# Patient Record
Sex: Female | Born: 2000 | Hispanic: Yes | Marital: Single | Smoking: Never smoker
Health system: Southern US, Community
[De-identification: ages and names within clinical notes are randomized; demographics above are authoritative.]

## PROBLEM LIST (undated history)

## (undated) ENCOUNTER — Ambulatory Visit: Discharge status: Against Medical Advice | Payer: Medicaid Other

## (undated) DIAGNOSIS — F32A Depression, unspecified: Secondary | ICD-10-CM

## (undated) DIAGNOSIS — F419 Anxiety disorder, unspecified: Secondary | ICD-10-CM

## (undated) DIAGNOSIS — J45909 Unspecified asthma, uncomplicated: Secondary | ICD-10-CM

## (undated) DIAGNOSIS — E669 Obesity, unspecified: Secondary | ICD-10-CM

## (undated) HISTORY — DX: Obesity, unspecified: E66.9

## (undated) HISTORY — DX: Unspecified asthma, uncomplicated: J45.909

## (undated) HISTORY — PX: WISDOM TOOTH EXTRACTION: SHX21

## (undated) HISTORY — DX: Anxiety disorder, unspecified: F41.9

## (undated) HISTORY — PX: TONSILLECTOMY: SUR1361

## (undated) HISTORY — DX: Depression, unspecified: F32.A

---
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
## 2020-03-13 ENCOUNTER — Other Ambulatory Visit: Payer: Self-pay

## 2020-03-13 ENCOUNTER — Encounter: Payer: Self-pay | Admitting: Family Medicine

## 2020-03-13 ENCOUNTER — Ambulatory Visit (LOCAL_COMMUNITY_HEALTH_CENTER): Payer: Medicaid Other | Admitting: Family Medicine

## 2020-03-13 VITALS — BP 120/76 | HR 80 | Ht 67.0 in | Wt 242.0 lb

## 2020-03-13 DIAGNOSIS — Z3201 Encounter for pregnancy test, result positive: Secondary | ICD-10-CM

## 2020-03-13 LAB — PREGNANCY, URINE: Preg Test, Ur: POSITIVE — AB

## 2020-04-28 ENCOUNTER — Telehealth: Payer: Self-pay

## 2020-04-28 NOTE — Telephone Encounter (Signed)
Via interpreter M. [place]-attempted to call to f/u +PT and if has prenatal care provider; left voicemail message Sharlette Dense, RN

## 2020-04-28 NOTE — Telephone Encounter (Signed)
Client returned call-reports went to [facility] and was told unable to have appt. Until 05/16/20 so now has appt to start care at [facility], RN

## 2020-11-21 ENCOUNTER — Other Ambulatory Visit: Payer: Self-pay

## 2020-11-21 ENCOUNTER — Emergency Department: Payer: Medicaid Other | Admitting: Anesthesiology

## 2020-11-21 ENCOUNTER — Ambulatory Visit
Admission: EM | Admit: 2020-11-21 | Discharge: 2020-11-22 | Disposition: A | Discharge status: Home / Self Care | Payer: Medicaid Other | Attending: Emergency Medicine | Admitting: Emergency Medicine

## 2020-11-21 ENCOUNTER — Emergency Department: Payer: Medicaid Other

## 2020-11-21 ENCOUNTER — Encounter
Admission: EM | Disposition: A | Discharge status: Home / Self Care | Payer: Self-pay | Source: Home / Self Care | Attending: Emergency Medicine

## 2020-11-21 DIAGNOSIS — Z20822 Contact with and (suspected) exposure to covid-19: Secondary | ICD-10-CM | POA: Diagnosis not present

## 2020-11-21 DIAGNOSIS — N939 Abnormal uterine and vaginal bleeding, unspecified: Secondary | ICD-10-CM

## 2020-11-21 DIAGNOSIS — R102 Pelvic and perineal pain unspecified side: Secondary | ICD-10-CM

## 2020-11-21 DIAGNOSIS — Z30432 Encounter for removal of intrauterine contraceptive device: Secondary | ICD-10-CM

## 2020-11-21 HISTORY — PX: DILATION AND CURETTAGE OF UTERUS: SHX78

## 2020-11-21 HISTORY — PX: IUD REMOVAL: SHX5392

## 2020-11-21 LAB — CBC WITH DIFFERENTIAL/PLATELET
Abs Immature Granulocytes: 0.1 10*3/uL — ABNORMAL HIGH (ref 0.00–0.07)
Basophils Absolute: 0 10*3/uL (ref 0.0–0.1)
Basophils Relative: 0 %
Eosinophils Absolute: 0.4 10*3/uL (ref 0.0–0.5)
Eosinophils Relative: 3 %
HCT: 34.3 % — ABNORMAL LOW (ref 36.0–46.0)
Hemoglobin: 11.2 g/dL — ABNORMAL LOW (ref 12.0–15.0)
Immature Granulocytes: 1 %
Lymphocytes Relative: 17 %
Lymphs Abs: 1.9 10*3/uL (ref 0.7–4.0)
MCH: 28.6 pg (ref 26.0–34.0)
MCHC: 32.7 g/dL (ref 30.0–36.0)
MCV: 87.7 fL (ref 80.0–100.0)
Monocytes Absolute: 0.7 10*3/uL (ref 0.1–1.0)
Monocytes Relative: 7 %
Neutro Abs: 7.8 10*3/uL — ABNORMAL HIGH (ref 1.7–7.7)
Neutrophils Relative %: 72 %
Platelets: 390 10*3/uL (ref 150–400)
RBC: 3.91 MIL/uL (ref 3.87–5.11)
RDW: 13.5 % (ref 11.5–15.5)
WBC: 10.9 10*3/uL — ABNORMAL HIGH (ref 4.0–10.5)
nRBC: 0 % (ref 0.0–0.2)

## 2020-11-21 LAB — HCG, QUANTITATIVE, PREGNANCY: hCG, Beta Chain, Quant, S: 19 m[IU]/mL — ABNORMAL HIGH (ref <5)

## 2020-11-21 LAB — COMPREHENSIVE METABOLIC PANEL
ALT: 46 U/L — ABNORMAL HIGH (ref 0–44)
AST: 53 U/L — ABNORMAL HIGH (ref 15–41)
Albumin: 3.7 g/dL (ref 3.5–5.0)
Alkaline Phosphatase: 165 U/L — ABNORMAL HIGH (ref 38–126)
Anion gap: 11 (ref 5–15)
BUN: 10 mg/dL (ref 6–20)
CO2: 22 mmol/L (ref 22–32)
Calcium: 9 mg/dL (ref 8.9–10.3)
Chloride: 105 mmol/L (ref 98–111)
Creatinine, Ser: 0.55 mg/dL (ref 0.44–1.00)
GFR, Estimated: >60 mL/min (ref >60)
Glucose, Bld: 79 mg/dL (ref 70–99)
Potassium: 3.6 mmol/L (ref 3.5–5.1)
Sodium: 138 mmol/L (ref 135–145)
Total Bilirubin: 0.6 mg/dL (ref 0.3–1.2)
Total Protein: 8.1 g/dL (ref 6.5–8.1)

## 2020-11-21 LAB — LIPASE, BLOOD: Lipase: 37 U/L (ref 11–51)

## 2020-11-21 LAB — RESP PANEL BY RT-PCR (FLU A&B, COVID) ARPGX2
Influenza A by PCR: NEGATIVE
Influenza B by PCR: NEGATIVE
SARS Coronavirus 2 by RT PCR: NEGATIVE

## 2020-11-21 SURGERY — DILATION AND CURETTAGE
Anesthesia: General | Site: Uterus

## 2020-11-21 MED ORDER — KETOROLAC TROMETHAMINE 30 MG/ML IJ SOLN
INTRAMUSCULAR | Status: DC | PRN
  Administered 2020-11-21: 30 mg via INTRAVENOUS

## 2020-11-21 MED ORDER — HYDROCODONE-ACETAMINOPHEN 5-325 MG PO TABS: 1.0000 | ORAL_TABLET | Freq: Three times a day (TID) | ORAL | 0 refills | Status: DC | PRN

## 2020-11-21 MED ORDER — DEXAMETHASONE SODIUM PHOSPHATE 10 MG/ML IJ SOLN
INTRAMUSCULAR | Status: AC
  Filled 2020-11-21: qty 1

## 2020-11-21 MED ORDER — IBUPROFEN 600 MG PO TABS: 600.0000 mg | ORAL_TABLET | Freq: Four times a day (QID) | ORAL | 0 refills | Status: DC | PRN

## 2020-11-21 MED ORDER — DOXYCYCLINE HYCLATE 100 MG IV SOLR
200.0000 mg | INTRAVENOUS | Status: AC
  Administered 2020-11-21: 200 mg via INTRAVENOUS
  Filled 2020-11-21: qty 200

## 2020-11-21 MED ORDER — POVIDONE-IODINE 10 % EX SWAB
2.0000 "application " | Freq: Once | CUTANEOUS | Status: DC
  Filled 2020-11-21: qty 2

## 2020-11-21 MED ORDER — PROPOFOL 10 MG/ML IV BOLUS
INTRAVENOUS | Status: DC | PRN
Start: 2020-11-21 — End: 2020-11-21
  Administered 2020-11-21: 200 mg via INTRAVENOUS

## 2020-11-21 MED ORDER — PROMETHAZINE HCL 25 MG/ML IJ SOLN
6.2500 mg | INTRAMUSCULAR | Status: DC | PRN
Start: 2020-11-21 — End: 2020-11-22

## 2020-11-21 MED ORDER — OXYCODONE HCL 5 MG/5ML PO SOLN: 5.0000 mg | Freq: Once | ORAL | Status: DC | PRN

## 2020-11-21 MED ORDER — HYDROMORPHONE HCL 1 MG/ML IJ SOLN
1.0000 mg | Freq: Once | INTRAMUSCULAR | Status: AC
  Administered 2020-11-21: 1 mg via INTRAVENOUS
  Filled 2020-11-21: qty 1

## 2020-11-21 MED ORDER — DEXMEDETOMIDINE (PRECEDEX) IN NS 20 MCG/5ML (4 MCG/ML) IV SYRINGE
PREFILLED_SYRINGE | INTRAVENOUS | Status: AC
  Filled 2020-11-21: qty 5

## 2020-11-21 MED ORDER — LIDOCAINE HCL (PF) 2 % IJ SOLN
INTRAMUSCULAR | Status: AC
  Filled 2020-11-21: qty 5

## 2020-11-21 MED ORDER — DEXAMETHASONE SODIUM PHOSPHATE 10 MG/ML IJ SOLN
INTRAMUSCULAR | Status: DC | PRN
  Administered 2020-11-21: 10 mg via INTRAVENOUS

## 2020-11-21 MED ORDER — ONDANSETRON HCL 4 MG/2ML IJ SOLN
4.0000 mg | Freq: Once | INTRAMUSCULAR | Status: AC
  Administered 2020-11-21: 4 mg via INTRAVENOUS
  Filled 2020-11-21: qty 2

## 2020-11-21 MED ORDER — ACETAMINOPHEN 10 MG/ML IV SOLN
1000.0000 mg | INTRAVENOUS | Status: AC
  Administered 2020-11-21: 1000 mg via INTRAVENOUS
  Filled 2020-11-21: qty 100

## 2020-11-21 MED ORDER — FENTANYL CITRATE (PF) 100 MCG/2ML IJ SOLN
INTRAMUSCULAR | Status: DC | PRN
  Administered 2020-11-21 (×2): 50 ug via INTRAVENOUS
  Administered 2020-11-21: 100 ug via INTRAVENOUS

## 2020-11-21 MED ORDER — IBUPROFEN 600 MG PO TABS
600.0000 mg | ORAL_TABLET | Freq: Once | ORAL | Status: AC
  Administered 2020-11-21: 600 mg via ORAL
  Filled 2020-11-21 (×2): qty 1

## 2020-11-21 MED ORDER — LACTATED RINGERS IV SOLN: INTRAVENOUS | Status: DC

## 2020-11-21 MED ORDER — KETOROLAC TROMETHAMINE 30 MG/ML IJ SOLN
INTRAMUSCULAR | Status: AC
  Filled 2020-11-21: qty 1

## 2020-11-21 MED ORDER — OXYCODONE HCL 5 MG PO TABS
5.0000 mg | ORAL_TABLET | Freq: Once | ORAL | Status: DC | PRN
Start: 2020-11-21 — End: 2020-11-22

## 2020-11-21 MED ORDER — MORPHINE SULFATE (PF) 4 MG/ML IV SOLN
4.0000 mg | Freq: Once | INTRAVENOUS | Status: AC
  Administered 2020-11-21: 4 mg via INTRAVENOUS
  Filled 2020-11-21: qty 1

## 2020-11-21 MED ORDER — SUCCINYLCHOLINE CHLORIDE 20 MG/ML IJ SOLN
INTRAMUSCULAR | Status: DC | PRN
  Administered 2020-11-21: 140 mg via INTRAVENOUS

## 2020-11-21 MED ORDER — SEVOFLURANE IN SOLN
RESPIRATORY_TRACT | Status: AC
  Filled 2020-11-21: qty 250

## 2020-11-21 MED ORDER — MIDAZOLAM HCL 2 MG/2ML IJ SOLN
INTRAMUSCULAR | Status: DC | PRN
  Administered 2020-11-21: 2 mg via INTRAVENOUS

## 2020-11-21 MED ORDER — LIDOCAINE HCL (CARDIAC) PF 100 MG/5ML IV SOSY
PREFILLED_SYRINGE | INTRAVENOUS | Status: DC | PRN
  Administered 2020-11-21: 100 mg via INTRAVENOUS

## 2020-11-21 MED ORDER — FENTANYL CITRATE (PF) 100 MCG/2ML IJ SOLN
INTRAMUSCULAR | Status: AC
  Filled 2020-11-21: qty 2

## 2020-11-21 MED ORDER — ONDANSETRON HCL 4 MG/2ML IJ SOLN
INTRAMUSCULAR | Status: AC
  Filled 2020-11-21: qty 2

## 2020-11-21 MED ORDER — PHENYLEPHRINE HCL (PRESSORS) 10 MG/ML IV SOLN
INTRAVENOUS | Status: DC | PRN
  Administered 2020-11-21: 100 ug via INTRAVENOUS

## 2020-11-21 MED ORDER — MIDAZOLAM HCL 2 MG/2ML IJ SOLN
INTRAMUSCULAR | Status: AC
  Filled 2020-11-21: qty 2

## 2020-11-21 MED ORDER — PROPOFOL 10 MG/ML IV BOLUS
INTRAVENOUS | Status: AC
  Filled 2020-11-21: qty 20

## 2020-11-21 MED ORDER — ONDANSETRON HCL 4 MG/2ML IJ SOLN
INTRAMUSCULAR | Status: DC | PRN
  Administered 2020-11-21: 4 mg via INTRAVENOUS

## 2020-11-21 MED ORDER — DEXMEDETOMIDINE (PRECEDEX) IN NS 20 MCG/5ML (4 MCG/ML) IV SYRINGE
PREFILLED_SYRINGE | INTRAVENOUS | Status: DC | PRN
  Administered 2020-11-21: 20 ug via INTRAVENOUS

## 2020-11-21 MED ORDER — SILVER NITRATE-POT NITRATE 75-25 % EX MISC
CUTANEOUS | Status: DC | PRN
  Administered 2020-11-21: 2

## 2020-11-21 MED ORDER — FENTANYL CITRATE (PF) 100 MCG/2ML IJ SOLN
25.0000 ug | INTRAMUSCULAR | Status: DC | PRN
Start: 2020-11-21 — End: 2020-11-22

## 2020-11-21 MED ORDER — SILVER NITRATE-POT NITRATE 75-25 % EX MISC
CUTANEOUS | Status: AC
  Filled 2020-11-21: qty 10

## 2020-11-21 MED ORDER — SUCCINYLCHOLINE CHLORIDE 200 MG/10ML IV SOSY
PREFILLED_SYRINGE | INTRAVENOUS | Status: AC
  Filled 2020-11-21: qty 10

## 2020-11-21 SURGICAL SUPPLY — 22 items
BAG URINE DRAIN 2000ML AR STRL (UROLOGICAL SUPPLIES) IMPLANT
CATH FOLEY 2WAY  5CC 16FR (CATHETERS)
CATH ROBINSON RED A/P 16FR (CATHETERS) ×4 IMPLANT
CATH URTH 16FR FL 2W BLN LF (CATHETERS) IMPLANT
COVER WAND RF STERILE (DRAPES) ×4 IMPLANT
FILTER UTR ASPR SPEC (MISCELLANEOUS) IMPLANT
FLTR UTR ASPR SPEC (MISCELLANEOUS)
GLOVE SURG ENC MOIS LTX SZ7 (GLOVE) ×20 IMPLANT
GOWN STRL REUS W/ TWL LRG LVL3 (GOWN DISPOSABLE) ×9 IMPLANT
GOWN STRL REUS W/TWL LRG LVL3 (GOWN DISPOSABLE) ×3
KIT BERKELEY 1ST TRIMESTER 3/8 (MISCELLANEOUS) IMPLANT
KIT TURNOVER CYSTO (KITS) ×4 IMPLANT
MANIFOLD NEPTUNE II (INSTRUMENTS) ×4 IMPLANT
NS IRRIG 500ML POUR BTL (IV SOLUTION) ×4 IMPLANT
PACK DNC HYST (MISCELLANEOUS) ×4 IMPLANT
PAD OB MATERNITY 4.3X12.25 (PERSONAL CARE ITEMS) ×4 IMPLANT
PAD PREP 24X41 OB/GYN DISP (PERSONAL CARE ITEMS) ×4 IMPLANT
SET BERKELEY SUCTION TUBING (SUCTIONS) IMPLANT
TOWEL OR 17X26 4PK STRL BLUE (TOWEL DISPOSABLE) ×4 IMPLANT
VACURETTE 10 RIGID CVD (CANNULA) IMPLANT
VACURETTE 12 RIGID CVD (CANNULA) IMPLANT
VACURETTE 8 RIGID CVD (CANNULA) IMPLANT

## 2020-11-21 NOTE — H&P (Addendum)
GYNECOLOGY HISTORY AND PHYSICAL NOTE  GYN Consultation  Attending Provider: Sharman Cheek, MD   Melanie Vasquez [id number] 11/21/2020 9:10 PM    Reason for Consultation:   Melanie Vasquez is a 20 y.o. G1P0000 premenopausal female seen at the request of Sharman Cheek, MD  for evaluation of heavy bleeding and abdominal pain in the postpartum setting.Marland Kitchen    History of Present Ilness:   The patient is PPD#6 from a vaginal delivery at [facility] that she states was uncomplicated.  She had been recovering well from her delivery and even went to a follow up appointment today. However at about 4PM today she had left lower hip pain that shot across her lower abdomen when she stood. She states the pain was severe and worse than when she gave birth.  The pain does not otherwise radiate.  She states that moving up and down makes the pain worse. Alleviating factors: only strong IV pain medication in the ER.  Associated symptoms: she notes heavy vaginal bleeding and the passage of clots today, which is new.  She also has cramps with the bleeding and clots.   Past Medical History:  Diagnosis Date  . Anxiety   . Asthma   . Depression    Past Surgical History:  Procedure Laterality Date  . TONSILLECTOMY    . WISDOM TOOTH EXTRACTION     No Known Allergies Prior to Admission medications   Medication Sig Start Date End Date Taking? Authorizing Provider  Prenatal Vit-Fe Fumarate-FA (PRENATAL VITAMIN PO) Take 1 tablet by mouth 1 day or 1 dose. 02/18/20   [provider]   Social History:  She  reports that she has never smoked. She has never used smokeless tobacco. She reports previous alcohol use. She reports that she does not use drugs.  Family History:  Diabetes in her paternal grandfather. She denies a history of cancer, hypertension.    Review of Systems:   Review of Systems  Constitutional: Negative.  Negative for chills and fever.  HENT: Negative.   Eyes: Negative.   Respiratory: Negative.    Cardiovascular: Negative.   Gastrointestinal: Positive for abdominal pain. Negative for blood in stool, constipation, diarrhea, melena, nausea and vomiting.  Genitourinary: Negative.  Negative for flank pain.  Musculoskeletal: Negative.   Skin: Negative.   Neurological: Negative.   Psychiatric/Behavioral: Negative.      Objective    BP 124/67   Pulse 92   Temp 98.7 F (37.1 C) (Oral)   Resp (!) 21   Ht 5\' 7"  (1.702 m)   Wt 120.7 kg   LMP 02/14/2020 (Exact Date) Comment: normal   SpO2 100%   BMI 41.66 kg/m  Physical Exam Constitutional:      General: She is not in acute distress.    Appearance: Normal appearance. She is well-developed.  Genitourinary:     Genitourinary Comments: deferred  HENT:     Head: Normocephalic and atraumatic.  Eyes:     General: No scleral icterus.    Conjunctiva/sclera: Conjunctivae normal.  Cardiovascular:     Rate and Rhythm: Normal rate and regular rhythm.     Heart sounds: No murmur heard. No friction rub. No gallop.   Pulmonary:     Effort: Pulmonary effort is normal. No respiratory distress.     Breath sounds: Normal breath sounds. No wheezing or rales.  Abdominal:     General: Bowel sounds are normal. There is no distension.     Palpations: Abdomen is soft. There is no mass.  Tenderness: There is abdominal tenderness (diffusely). There is no right CVA tenderness, left CVA tenderness, guarding or rebound.  Musculoskeletal:        General: Normal range of motion.     Cervical back: Normal range of motion and neck supple.  Neurological:     General: No focal deficit present.     Mental Status: She is alert and oriented to person, place, and time.     Cranial Nerves: No cranial nerve deficit.  Skin:    General: Skin is warm and dry.     Findings: No erythema.  Psychiatric:        Mood and Affect: Mood normal.        Behavior: Behavior normal.        Judgment: Judgment normal.      Laboratory Results:   Lab Results   Component Value Date   WBC 10.9 (H) 11/21/2020   RBC 3.91 11/21/2020   HGB 11.2 (L) 11/21/2020   HCT 34.3 (L) 11/21/2020   PLT 390 11/21/2020   NA 138 11/21/2020   K 3.6 11/21/2020   CREATININE 0.55 11/21/2020   Lab Results  Component Value Date   PREGTESTUR Positive (A) 03/13/2020    Imaging Results US PELVIS (TRANSABDOMINAL ONLY)  Result Date: 11/21/2020 CLINICAL DATA:  Pelvic pain status post vaginal delivery. EXAM: TRANSABDOMINAL ULTRASOUND OF PELVIS DOPPLER ULTRASOUND OF OVARIES TECHNIQUE: Transabdominal ultrasound examination of the pelvis was performed including evaluation of the uterus, ovaries, adnexal regions, and pelvic cul-de-sac. Color and duplex Doppler ultrasound was utilized to evaluate blood flow to the ovaries. COMPARISON:  None. FINDINGS: Uterus Measurements: 16.6 x 11.2 x 7.6 cm = volume: 740 mL. No fibroids or other mass visualized. Endometrium Thickness: 15 mm. Fluid and clot is seen within the endometrial space with some flow noted within this area on color Doppler suggesting retained products of conception. Right ovary Measurements: 3.6 x 2.9 x 2.1 cm = volume: 12 mL. Normal appearance/no adnexal mass. Left ovary Measurements: 3.9 x 4.2 x 2.9 cm = volume: 25 mL. Normal appearance/no adnexal mass. Pulsed Doppler evaluation demonstrates normal low-resistance arterial and venous waveforms in both ovaries. Other: No free fluid is noted. IMPRESSION: Mild amount of fluid and high density material is noted in endometrial space which demonstrates some flow on Doppler, which suggest retained products of conception. Electronically Signed   By: Lupita Raider M.D.   On: 11/21/2020 19:03   US PELVIC DOPPLER LIMITED  Result Date: 11/21/2020 CLINICAL DATA:  Pelvic pain status post vaginal delivery. EXAM: TRANSABDOMINAL ULTRASOUND OF PELVIS DOPPLER ULTRASOUND OF OVARIES TECHNIQUE: Transabdominal ultrasound examination of the pelvis was performed including evaluation of the uterus,  ovaries, adnexal regions, and pelvic cul-de-sac. Color and duplex Doppler ultrasound was utilized to evaluate blood flow to the ovaries. COMPARISON:  None. FINDINGS: Uterus Measurements: 16.6 x 11.2 x 7.6 cm = volume: 740 mL. No fibroids or other mass visualized. Endometrium Thickness: 15 mm. Fluid and clot is seen within the endometrial space with some flow noted within this area on color Doppler suggesting retained products of conception. Right ovary Measurements: 3.6 x 2.9 x 2.1 cm = volume: 12 mL. Normal appearance/no adnexal mass. Left ovary Measurements: 3.9 x 4.2 x 2.9 cm = volume: 25 mL. Normal appearance/no adnexal mass. Pulsed Doppler evaluation demonstrates normal low-resistance arterial and venous waveforms in both ovaries. Other: No free fluid is noted. IMPRESSION: Mild amount of fluid and high density material is noted in endometrial space which demonstrates some flow  on Doppler, which suggest retained products of conception. Electronically Signed   By: Lupita Raider M.D.   On: 11/21/2020 19:03      Assessment & Plan   Melanie Vasquez is a 20 y.o. G1P1001 premenopausal female who is postpartum day #6 from a vaginal delivery with retained products of conception likely causing her abdominal pain and bleeding.   Plan:  1.  I recommend she go to the OR for a dilation and curettage to remove the products of conception. We discussed the surgery in detail. The risks include, but are not limited to: bleeding, infection, uterine perforation, damage to organs nearby like bowel, bladder, nerves, blood vessels, reactions to anesthesia medications.  2.  She voiced understanding and agreement to go to the OR.   3. OR aware. She last ate or drank prior to 1PM today. 4.  COVID negative testing.  A total of 45 minutes were spent face-to-face with the patient as well as preparation, review, communication, and documentation during this encounter.    Thomasene Mohair, MD 11/21/2020 9:10 PM

## 2020-11-21 NOTE — Discharge Instructions (Signed)

## 2020-11-21 NOTE — Anesthesia Preprocedure Evaluation (Addendum)
Anesthesia Evaluation  Patient identified by MRN, date of birth, ID band Patient awake    Reviewed: Allergy & Precautions, H&P , NPO status , Patient's Chart, lab work & pertinent test results  History of Anesthesia Complications Negative for: history of anesthetic complications  Airway Mallampati: II  TM Distance: >3 FB     Dental  (+) Teeth Intact   Pulmonary asthma , neg sleep apnea, neg COPD,           Cardiovascular (-) angina(-) Past MI and (-) Cardiac Stents negative cardio ROS  (-) dysrhythmias      Neuro/Psych PSYCHIATRIC DISORDERS Anxiety Depression negative neurological ROS     GI/Hepatic negative GI ROS, Neg liver ROS,   Endo/Other  Morbid obesity  Renal/GU      Musculoskeletal   Abdominal   Peds  Hematology negative hematology ROS (+)   Anesthesia Other Findings Lip piercing  Past Medical History: No date: Anxiety No date: Asthma No date: Depression  Past Surgical History: No date: TONSILLECTOMY No date: WISDOM TOOTH EXTRACTION  BMI    Body Mass Index: 41.66 kg/m      Reproductive/Obstetrics Postpartum day #6                            Anesthesia Physical Anesthesia Plan  ASA: III  Anesthesia Plan: General ETT and Modified Rapid Sequence   Post-op Pain Management:    Induction:   PONV Risk Score and Plan: Ondansetron, Dexamethasone, Midazolam and Treatment may vary due to age or medical condition  Airway Management Planned:   Additional Equipment:   Intra-op Plan:   Post-operative Plan:   Informed Consent: I have reviewed the patients History and Physical, chart, labs and discussed the procedure including the risks, benefits and alternatives for the proposed anesthesia with the patient or authorized representative who has indicated his/her understanding and acceptance.     Dental Advisory Given  Plan Discussed with: Anesthesiologist, CRNA and  Surgeon  Anesthesia Plan Comments:        Anesthesia Quick Evaluation

## 2020-11-21 NOTE — ED Notes (Signed)
OB at bedside

## 2020-11-21 NOTE — ED Notes (Signed)
This RN was called to ultrasound. Pt states she felt like she was "going to pass out." Pt pale, hypertensive, tachy on RN arrival. Within 1 minute of RN arrival color returned to patient. Recheck of vitals stable, normotensive, tachy in low 100's. Pt states feeling had passed. MD notified.

## 2020-11-21 NOTE — Transfer of Care (Signed)
Immediate Anesthesia Transfer of Care Note  Patient: Melanie Vasquez  Procedure(s) Performed: DILATATION AND CURETTAGE (N/A Uterus) incidental INTRAUTERINE DEVICE (IUD) REMOVAL (Uterus)  Patient Location: PACU  Anesthesia Type:General  Level of Consciousness: drowsy and patient cooperative  Airway & Oxygen Therapy: Patient Spontanous Breathing and Patient connected to face mask oxygen  Post-op Assessment: Report given to RN and Post -op Vital signs reviewed and stable  Post vital signs: Reviewed and stable  Last Vitals:  Vitals Value Taken Time  BP 118/70 11/21/20 2305  Temp 36.8 C 11/21/20 2305  Pulse 94 11/21/20 2311  Resp 23 11/21/20 2311  SpO2 100 % 11/21/20 2311  Vitals shown include unvalidated device data.  Last Pain:  Vitals:   11/21/20 2305  TempSrc:   PainSc: 0-No pain         Complications: No complications documented.

## 2020-11-21 NOTE — ED Notes (Signed)
Pt to US.

## 2020-11-21 NOTE — ED Triage Notes (Signed)
Pt states that she had a vaginal delivery last Saturday, had a good post pardom  Visit today and then about 45 min ago started having severe contraction type pain and states that her bleeding has increased with clots, states 4-6 pads today

## 2020-11-21 NOTE — Anesthesia Procedure Notes (Signed)
Procedure Name: Intubation Date/Time: 11/21/2020 10:13 PM Performed by: Omer Jack, CRNA Pre-anesthesia Checklist: Patient identified, Patient being monitored, Timeout performed, Emergency Drugs available and Suction available Patient Re-evaluated:Patient Re-evaluated prior to induction Oxygen Delivery Method: Circle system utilized Preoxygenation: Pre-oxygenation with 100% oxygen Induction Type: IV induction Ventilation: Mask ventilation without difficulty Laryngoscope Size: 3 and McGraph Grade View: Grade I Tube type: Oral Tube size: 7.0 mm Number of attempts: 1 Airway Equipment and Method: Stylet Placement Confirmation: ETT inserted through vocal cords under direct vision,  positive ETCO2 and breath sounds checked- equal and bilateral Secured at: 21 cm Tube secured with: Tape Dental Injury: Teeth and Oropharynx as per pre-operative assessment

## 2020-11-21 NOTE — Op Note (Signed)
  Operative Note    Name: Melanie Vasquez  Date of Service: 11/21/2020  DOB: 2001-04-14  MRN: [id number]   Pre-Operative Diagnosis: Retained products of conception  Post-Operative Diagnosis: Retained products of conception  Procedures:  1. Dilation and curettage 2. Incidental removal of intrauterine device   Primary Surgeon: Thomasene Mohair, MD   EBL: 30 mL   IVF: 550 mL   Urine output: 600 mL  Specimens: endometrial curetting/products of conception  Drains: none  Complications: None   Disposition: PACU   Condition: Stable   Findings:  1) medium amount of tissue within the uterus 2) Copper intrauterine device incidentally noted in the uterus and removed as part of the procedure.  Procedure Summary:   The patient was taken to the OR where general anesthesia was administered and found to be adequate.  She was placed in the dorsal, supine lithotomy position in candy cane stirrups. She was prepped and draped in the usual, sterile fashion.  After a time-out was called, an in-and-out catheter was used to empty her bladder. A sterile speculum was placed in her vagina and her cervix was visualized and appeared normal. No IUD strings noted. The cervix was serially dilated using Hegar dilators to 8 mm.  A blunt curette was used to make several passes with small return of tissue.  Strings were noted at this point at the external cervical os and grasped and with tension a copper IUD was removed.  A serrated curette was then used with a more significant amount of tissue removed until a gritty texture was noted throughout.    This terminated the procedure. Hemostasis was noted from the uterus and cervix. The tenaculum was removed and hemostasis was noted at the tenaculum entry sites after application of silver nitrate.  All instruments and sponges were removed from the vagina.    The patient tolerated the procedure well.  Sponge, lap, needle, and instrument counts were correct x 2.  VTE  prophylaxis: SCDs. Antibiotic prophylaxis: Doxycycline 200 mg IV prior to the start of the case. She was awakened in the operating room and was taken to the PACU in stable condition.   Thomasene Mohair, MD 11/21/2020 10:49 PM

## 2020-11-21 NOTE — ED Notes (Signed)
Pt back from US

## 2020-11-21 NOTE — ED Provider Notes (Signed)
[facility] Emergency Department Provider Note  ____________________________________________  Time seen: Approximately 5:43 PM  I have reviewed the triage vital signs and the nursing notes.   HISTORY  Chief Complaint Vaginal Bleeding and Postpartum Complications    HPI Melanie Vasquez is a 20 y.o. female with a history of anxiety depression and gestational hypertension who had a vaginal delivery  November 15, 2020, uncomplicated.  Patient reports breast and bottlefeeding, doing well, no discharge and then today started having severe contraction pelvic pain radiating to the back with vaginal bleeding and clots.  Pain is constant, no aggravating or alleviating factors, severe.  No fever or vomiting.  No diarrhea.     Past Medical History:  Diagnosis Date  . Anxiety   . Asthma   . Depression      There are no problems to display for this patient.    Past Surgical History:  Procedure Laterality Date  . TONSILLECTOMY    . WISDOM TOOTH EXTRACTION       Prior to Admission medications   Medication Sig Start Date End Date Taking? Authorizing Provider  oseltamivir (TAMIFLU) 75 MG capsule Take 1 capsule (75 mg total) by mouth 2 (two) times daily. Patient not taking: Reported on 03/13/2020 11/29/17   Faythe Ghee, PA-C  Prenatal Vit-Fe Fumarate-FA (PRENATAL VITAMIN PO) Take 1 tablet by mouth 1 day or 1 dose. 02/18/20   [provider]     Allergies Patient has no known allergies.   No family history on file.  Social History Social History   Tobacco Use  . Smoking status: Never Smoker  . Smokeless tobacco: Never Used  Vaping Use  . Vaping Use: Every day  . Last attempt to quit: 03/11/2020  Substance Use Topics  . Alcohol use: Not Currently    Comment: last drink in 02/22/20  . Drug use: Never    Review of Systems  Constitutional:   No fever or chills.  ENT:   No sore throat. No rhinorrhea. Cardiovascular:   No chest pain or  syncope. Respiratory:   No dyspnea or cough. Gastrointestinal:   Positive as above for pelvic pain.  No vomiting or diarrhea.  Musculoskeletal:   Negative for focal pain or swelling All other systems reviewed and are negative except as documented above in ROS and HPI.  ____________________________________________   PHYSICAL EXAM:  VITAL SIGNS: ED Triage Vitals  Enc Vitals Group     BP 11/21/20 1623 (!) 161/82     Pulse Rate 11/21/20 1623 (!) 102     Resp 11/21/20 1623 20     Temp 11/21/20 1623 98.7 F (37.1 C)     Temp Source 11/21/20 1623 Oral     SpO2 11/21/20 1623 99 %     Weight 11/21/20 1624 266 lb (120.7 kg)     Height 11/21/20 1624 5\' 7"  (1.702 m)     Head Circumference --      Peak Flow --      Pain Score 11/21/20 1624 10     Pain Loc --      Pain Edu? --      Excl. in GC? --     Vital signs reviewed, nursing assessments reviewed.   Constitutional:   Alert and oriented. Non-toxic appearance.  Very uncomfortable Eyes:   Conjunctivae are normal. EOMI. PERRL. ENT      Head:   Normocephalic and atraumatic.      Nose:   Wearing a mask.  Mouth/Throat:   Wearing a mask.      Neck:   No meningismus. Full ROM. Hematological/Lymphatic/Immunilogical:   No cervical lymphadenopathy. Cardiovascular:   RRR. Symmetric bilateral radial and DP pulses.  No murmurs. Cap refill less than 2 seconds. Respiratory:   Normal respiratory effort without tachypnea/retractions. Breath sounds are clear and equal bilaterally. No wheezes/rales/rhonchi. Gastrointestinal:   Soft with diffuse lower abdominal tenderness. Non distended. No rebound, rigidity, or guarding.  Musculoskeletal:   Normal range of motion in all extremities. No joint effusions.  No lower extremity tenderness.  No edema. Neurologic:   Normal speech and language.  Motor grossly intact. No acute focal neurologic deficits are appreciated.  Skin:    Skin is warm, dry and intact. No rash noted.  No petechiae, purpura, or  bullae.  ____________________________________________    LABS (pertinent positives/negatives) (all labs ordered are listed, but only abnormal results are displayed) Labs Reviewed  COMPREHENSIVE METABOLIC PANEL - Abnormal; Notable for the following components:      Result Value   AST 53 (*)    ALT 46 (*)    Alkaline Phosphatase 165 (*)    All other components within normal limits  CBC WITH DIFFERENTIAL/PLATELET - Abnormal; Notable for the following components:   WBC 10.9 (*)    Hemoglobin 11.2 (*)    HCT 34.3 (*)    Neutro Abs 7.8 (*)    Abs Immature Granulocytes 0.10 (*)    All other components within normal limits  HCG, QUANTITATIVE, PREGNANCY - Abnormal; Notable for the following components:   hCG, Beta Chain, Quant, S 19 (*)    All other components within normal limits  RESP PANEL BY RT-PCR (FLU A&B, COVID) ARPGX2  LIPASE, BLOOD  URINALYSIS, COMPLETE (UACMP) WITH MICROSCOPIC   ____________________________________________   EKG    ____________________________________________    RADIOLOGY  US PELVIS (TRANSABDOMINAL ONLY)  Result Date: 11/21/2020 CLINICAL DATA:  Pelvic pain status post vaginal delivery. EXAM: TRANSABDOMINAL ULTRASOUND OF PELVIS DOPPLER ULTRASOUND OF OVARIES TECHNIQUE: Transabdominal ultrasound examination of the pelvis was performed including evaluation of the uterus, ovaries, adnexal regions, and pelvic cul-de-sac. Color and duplex Doppler ultrasound was utilized to evaluate blood flow to the ovaries. COMPARISON:  None. FINDINGS: Uterus Measurements: 16.6 x 11.2 x 7.6 cm = volume: 740 mL. No fibroids or other mass visualized. Endometrium Thickness: 15 mm. Fluid and clot is seen within the endometrial space with some flow noted within this area on color Doppler suggesting retained products of conception. Right ovary Measurements: 3.6 x 2.9 x 2.1 cm = volume: 12 mL. Normal appearance/no adnexal mass. Left ovary Measurements: 3.9 x 4.2 x 2.9 cm = volume: 25  mL. Normal appearance/no adnexal mass. Pulsed Doppler evaluation demonstrates normal low-resistance arterial and venous waveforms in both ovaries. Other: No free fluid is noted. IMPRESSION: Mild amount of fluid and high density material is noted in endometrial space which demonstrates some flow on Doppler, which suggest retained products of conception. Electronically Signed   By: Lupita Raider M.D.   On: 11/21/2020 19:03   US PELVIC DOPPLER LIMITED  Result Date: 11/21/2020 CLINICAL DATA:  Pelvic pain status post vaginal delivery. EXAM: TRANSABDOMINAL ULTRASOUND OF PELVIS DOPPLER ULTRASOUND OF OVARIES TECHNIQUE: Transabdominal ultrasound examination of the pelvis was performed including evaluation of the uterus, ovaries, adnexal regions, and pelvic cul-de-sac. Color and duplex Doppler ultrasound was utilized to evaluate blood flow to the ovaries. COMPARISON:  None. FINDINGS: Uterus Measurements: 16.6 x 11.2 x 7.6 cm = volume: 740 mL.  No fibroids or other mass visualized. Endometrium Thickness: 15 mm. Fluid and clot is seen within the endometrial space with some flow noted within this area on color Doppler suggesting retained products of conception. Right ovary Measurements: 3.6 x 2.9 x 2.1 cm = volume: 12 mL. Normal appearance/no adnexal mass. Left ovary Measurements: 3.9 x 4.2 x 2.9 cm = volume: 25 mL. Normal appearance/no adnexal mass. Pulsed Doppler evaluation demonstrates normal low-resistance arterial and venous waveforms in both ovaries. Other: No free fluid is noted. IMPRESSION: Mild amount of fluid and high density material is noted in endometrial space which demonstrates some flow on Doppler, which suggest retained products of conception. Electronically Signed   By: Lupita Raider M.D.   On: 11/21/2020 19:03    ____________________________________________   PROCEDURES Procedures  ____________________________________________  DIFFERENTIAL DIAGNOSIS   Retained products of conception,  ovarian cyst, ovarian torsion, endometritis  CLINICAL IMPRESSION / ASSESSMENT AND PLAN / ED COURSE  Medications ordered in the ED: Medications  ondansetron (ZOFRAN) injection 4 mg (4 mg Intravenous Given 11/21/20 1635)  morphine 4 MG/ML injection 4 mg (4 mg Intravenous Given 11/21/20 1637)  HYDROmorphone (DILAUDID) injection 1 mg (1 mg Intravenous Given 11/21/20 1652)    Pertinent labs & imaging results that were available during my care of the patient were reviewed by me and considered in my medical decision making (see chart for details).  Britanny Marksberry was evaluated in Emergency Department on 11/21/2020 for the symptoms described in the history of present illness. She was evaluated in the context of the global COVID-19 pandemic, which necessitated consideration that the patient might be at risk for infection with the SARS-CoV-2 virus that causes COVID-19. Institutional protocols and algorithms that pertain to the evaluation of patients at risk for COVID-19 are in a state of rapid change based on information released by regulatory bodies including the CDC and federal and state organizations. These policies and algorithms were followed during the patient's care in the ED.   Patient presents with severe pelvic pain 6 days after vaginal birth.  Patient given initial morphine 4 mg IV, Zofran 4 mg IV for pain and nausea control, which was ineffective so she was later given Dilaudid 1 mg IV.  Obtaining ultrasound.  Clinical Course as of 11/21/20 2005  Fri Nov 21, 2020  1910 Ultrasound shows retained POC's.  Will discuss with OB [PS]    Clinical Course User Index [PS] Sharman Cheek, MD     ----------------------------------------- 8:05 PM on 11/21/2020 -----------------------------------------  Discussed with Dr. Jean Rosenthal who will evaluate.  Pain currently controlled.  ____________________________________________   FINAL CLINICAL IMPRESSION(S) / ED DIAGNOSES    Final diagnoses:   Vaginal bleeding  Retained products of conception, postpartum     ED Discharge Orders    None      Portions of this note were generated with dragon dictation software. Dictation errors may occur despite best attempts at proofreading.   Sharman Cheek, MD 11/21/20 2005

## 2020-11-22 ENCOUNTER — Encounter: Payer: Self-pay | Admitting: Obstetrics and Gynecology

## 2020-11-22 NOTE — Anesthesia Postprocedure Evaluation (Signed)
Anesthesia Post Note  Patient: Melanie Vasquez  Procedure(s) Performed: DILATATION AND CURETTAGE (N/A Uterus) incidental INTRAUTERINE DEVICE (IUD) REMOVAL (Uterus)  Patient location during evaluation: PACU Anesthesia Type: General Level of consciousness: awake and alert Pain management: pain level controlled Vital Signs Assessment: post-procedure vital signs reviewed and stable Respiratory status: spontaneous breathing, nonlabored ventilation and respiratory function stable Cardiovascular status: blood pressure returned to baseline and stable Postop Assessment: no apparent nausea or vomiting Anesthetic complications: no   No complications documented.   Last Vitals:  Vitals:   11/22/20 0000 11/22/20 0008  BP: 122/68 139/82  Pulse: 84   Resp: (!) 22   Temp:    SpO2: 99%     Last Pain:  Vitals:   11/22/20 0008  TempSrc:   PainSc: 0-No pain                 Aurelio Brash Jaisa Defino

## 2020-11-25 LAB — SURGICAL PATHOLOGY

## 2020-12-15 ENCOUNTER — Other Ambulatory Visit: Payer: Self-pay

## 2020-12-15 ENCOUNTER — Emergency Department: Payer: Medicaid Other

## 2020-12-15 ENCOUNTER — Emergency Department
Admission: EM | Admit: 2020-12-15 | Discharge: 2020-12-15 | Disposition: A | Discharge status: Home / Self Care | Payer: Medicaid Other | Attending: Student in an Organized Health Care Education/Training Program | Admitting: Student in an Organized Health Care Education/Training Program

## 2020-12-15 ENCOUNTER — Encounter: Payer: Self-pay | Admitting: Emergency Medicine

## 2020-12-15 DIAGNOSIS — R1030 Lower abdominal pain, unspecified: Secondary | ICD-10-CM

## 2020-12-15 DIAGNOSIS — N939 Abnormal uterine and vaginal bleeding, unspecified: Secondary | ICD-10-CM

## 2020-12-15 DIAGNOSIS — N920 Excessive and frequent menstruation with regular cycle: Secondary | ICD-10-CM

## 2020-12-15 DIAGNOSIS — J45909 Unspecified asthma, uncomplicated: Secondary | ICD-10-CM | POA: Diagnosis not present

## 2020-12-15 LAB — BASIC METABOLIC PANEL
Anion gap: 7 (ref 5–15)
BUN: 17 mg/dL (ref 6–20)
CO2: 24 mmol/L (ref 22–32)
Calcium: 8.9 mg/dL (ref 8.9–10.3)
Chloride: 108 mmol/L (ref 98–111)
Creatinine, Ser: 0.67 mg/dL (ref 0.44–1.00)
GFR, Estimated: >60 mL/min (ref >60)
Glucose, Bld: 84 mg/dL (ref 70–99)
Potassium: 3.7 mmol/L (ref 3.5–5.1)
Sodium: 139 mmol/L (ref 135–145)

## 2020-12-15 LAB — CBC
HCT: 34.6 % — ABNORMAL LOW (ref 36.0–46.0)
Hemoglobin: 11.2 g/dL — ABNORMAL LOW (ref 12.0–15.0)
MCH: 27.5 pg (ref 26.0–34.0)
MCHC: 32.4 g/dL (ref 30.0–36.0)
MCV: 85 fL (ref 80.0–100.0)
Platelets: 283 10*3/uL (ref 150–400)
RBC: 4.07 MIL/uL (ref 3.87–5.11)
RDW: 13.1 % (ref 11.5–15.5)
WBC: 5.7 10*3/uL (ref 4.0–10.5)
nRBC: 0 % (ref 0.0–0.2)

## 2020-12-15 LAB — HCG, QUANTITATIVE, PREGNANCY: hCG, Beta Chain, Quant, S: 1 m[IU]/mL (ref <5)

## 2020-12-15 LAB — POC URINE PREG, ED: Preg Test, Ur: NEGATIVE

## 2020-12-15 MED ORDER — IOHEXOL 300 MG/ML  SOLN
125.0000 mL | Freq: Once | INTRAMUSCULAR | Status: AC | PRN
  Administered 2020-12-15: 125 mL via INTRAVENOUS
  Filled 2020-12-15: qty 125

## 2020-12-15 NOTE — Discharge Instructions (Addendum)
Follow-up with your regular doctor.  Please call for an appointment.  Return if worsening. 

## 2020-12-15 NOTE — ED Provider Notes (Signed)
[facility] Emergency Department Provider Note  ____________________________________________   Event Date/Time   First MD Initiated Contact with Patient 12/15/20 1215     (approximate)  I have reviewed the triage vital signs and the nursing notes.   HISTORY  Chief Complaint Vaginal Bleeding and Loss of Consciousness    HPI Melanie Vasquez is a 20 y.o. female presents emergency department complaining of heavy vaginal bleeding for 3 days.  States that she had had a D&C approximately 1 month ago for retained placenta after delivering a baby.  She denies any edema or chills.  States that she does have diffuse abdominal pain.  No vomiting or diarrhea.  States that she is going through 1 pad per hour    Past Medical History:  Diagnosis Date  . Anxiety   . Asthma   . Depression     Patient Active Problem List   Diagnosis Date Noted  . Retained products of conception, postpartum 11/21/2020    Past Surgical History:  Procedure Laterality Date  . DILATION AND CURETTAGE OF UTERUS N/A 11/21/2020   Procedure: DILATATION AND CURETTAGE;  Surgeon: Conard Novak, MD;  Location: [facility] ORS;  Service: Gynecology;  Laterality: N/A;  . IUD REMOVAL  11/21/2020   Procedure: incidental INTRAUTERINE DEVICE (IUD) REMOVAL;  Surgeon: Conard Novak, MD;  Location: [facility] ORS;  Service: Gynecology;;  . TONSILLECTOMY    . WISDOM TOOTH EXTRACTION      Prior to Admission medications   Medication Sig Start Date End Date Taking? Authorizing Provider  HYDROcodone-acetaminophen (NORCO) 5-325 MG tablet Take 1 tablet by mouth every 8 (eight) hours as needed for moderate pain. 11/21/20   Conard Novak, MD  ibuprofen (ADVIL) 600 MG tablet Take 1 tablet (600 mg total) by mouth every 6 (six) hours as needed for mild pain or cramping. 11/21/20   Conard Novak, MD  Prenatal Vit-Fe Fumarate-FA (PRENATAL VITAMIN PO) Take 1 tablet by mouth daily. 02/18/20   [provider]     Allergies Patient has no known allergies.  No family history on file.  Social History Social History   Tobacco Use  . Smoking status: Never Smoker  . Smokeless tobacco: Never Used  Vaping Use  . Vaping Use: Every day  . Last attempt to quit: 03/11/2020  Substance Use Topics  . Alcohol use: Not Currently    Comment: last drink in 02/22/20  . Drug use: Never    Review of Systems  Constitutional: No fever/chills Eyes: No visual changes. ENT: No sore throat. Respiratory: Denies cough Cardiovascular: Denies chest pain Gastrointestinal: Positive abdominal pain Genitourinary: Negative for dysuria. Musculoskeletal: Negative for back pain. Skin: Negative for rash. Psychiatric: no mood changes,     ____________________________________________   PHYSICAL EXAM:  VITAL SIGNS: ED Triage Vitals  Enc Vitals Group     BP 12/15/20 1153 113/70     Pulse Rate 12/15/20 1153 78     Resp 12/15/20 1153 18     Temp 12/15/20 1153 98.8 F (37.1 C)     Temp Source 12/15/20 1153 Oral     SpO2 12/15/20 1153 99 %     Weight 12/15/20 1154 260 lb (117.9 kg)     Height 12/15/20 1154 5\' 7"  (1.702 m)     Head Circumference --      Peak Flow --      Pain Score 12/15/20 1154 5     Pain Loc --      Pain Edu? --  Excl. in GC? --     Constitutional: Alert and oriented. Well appearing and in no acute distress. Eyes: Conjunctivae are normal.  Head: Atraumatic. Nose: No congestion/rhinnorhea. Mouth/Throat: Mucous membranes are moist.   Neck:  supple no lymphadenopathy noted Cardiovascular: Normal rate, regular rhythm. Heart sounds are normal Respiratory: Normal respiratory effort.  No retractions, lungs c t a  Abd: soft tender in the right upper quadrant and right lower quadrant, bs normal all 4 quad GU: deferred Musculoskeletal: FROM all extremities, warm and well perfused Neurologic:  Normal speech and language.  Skin:  Skin is warm, dry and intact. No rash noted. Psychiatric:  Mood and affect are normal. Speech and behavior are normal.  ____________________________________________   LABS (all labs ordered are listed, but only abnormal results are displayed)  Labs Reviewed  CBC - Abnormal; Notable for the following components:      Result Value   Hemoglobin 11.2 (*)    HCT 34.6 (*)    All other components within normal limits  BASIC METABOLIC PANEL  HCG, QUANTITATIVE, PREGNANCY  POC URINE PREG, ED   ____________________________________________   ____________________________________________  RADIOLOGY  Ultrasound of the pelvis with transvaginal CT abdomen/pelvis with IV contrast  ____________________________________________   PROCEDURES  Procedure(s) performed: No  Procedures    ____________________________________________   INITIAL IMPRESSION / ASSESSMENT AND PLAN / ED COURSE  Pertinent labs & imaging results that were available during my care of the patient were reviewed by me and considered in my medical decision making (see chart for details).   Patient is a 20 year old female presents with vaginal bleeding and abdominal pain.  See HPI.  Physical exam shows patient appears stable  DDx: Menorrhagia, acute appendicitis, acute cholecystitis, retained products, abscess/infection of the pelvis  CBC is normal, basic metabolic panel is normal, beta-hCG is less than 1, POC pregnancy is negative  Ultrasound does not show any acute abnormalities  CT abdomen/pelvis is negative  Explained the findings to the patient.  She is to follow-up with her regular doctor if not improving to 3 days.  Return emergency department worsening.  She was discharged stable condition care of her husband     Melanie Vasquez was evaluated in Emergency Department on 12/15/2020 for the symptoms described in the history of present illness. She was evaluated in the context of the global COVID-19 pandemic, which necessitated consideration that the patient might be at  risk for infection with the SARS-CoV-2 virus that causes COVID-19. Institutional protocols and algorithms that pertain to the evaluation of patients at risk for COVID-19 are in a state of rapid change based on information released by regulatory bodies including the CDC and federal and state organizations. These policies and algorithms were followed during the patient's care in the ED.    As part of my medical decision making, I reviewed the following data within the electronic MEDICAL RECORD NUMBER Nursing notes reviewed and incorporated, Labs reviewed , Old chart reviewed, Radiograph reviewed , Notes from prior ED visits and [place] Controlled Substance Database  ____________________________________________   FINAL CLINICAL IMPRESSION(S) / ED DIAGNOSES  Final diagnoses:  Heavy menstrual bleeding  Vaginal bleeding  Lower abdominal pain      NEW MEDICATIONS STARTED DURING THIS VISIT:  New Prescriptions   No medications on file     Note:  This document was prepared using Dragon voice recognition software and may include unintentional dictation errors.    Faythe Ghee, PA-C 12/15/20 1609    Delton [place], MD 12/15/20 [phone]

## 2020-12-15 NOTE — ED Triage Notes (Addendum)
Patient to ER for heavy bleeding x3 days. Reports having baby 1 month ago (had retained placenta at that time, unable to say if there was any infection issues). Patient states she has been soaking through one pad per hour for last three days. Patient also reports syncopal episode this am. Denies hitting head.

## 2021-03-28 ENCOUNTER — Other Ambulatory Visit: Payer: Self-pay

## 2021-03-28 ENCOUNTER — Ambulatory Visit
Admission: EM | Admit: 2021-03-28 | Discharge: 2021-03-28 | Disposition: A | Discharge status: Home / Self Care | Payer: Medicaid Other | Attending: Family Medicine | Admitting: Family Medicine

## 2021-03-28 DIAGNOSIS — J029 Acute pharyngitis, unspecified: Secondary | ICD-10-CM | POA: Diagnosis present

## 2021-03-28 LAB — POCT RAPID STREP A: Streptococcus, Group A Screen (Direct): NEGATIVE

## 2021-03-28 MED ORDER — LIDOCAINE VISCOUS HCL 2 % MT SOLN: OROMUCOSAL | 0 refills | Status: DC

## 2021-03-28 MED ORDER — VALACYCLOVIR HCL 1 G PO TABS: 1000.0000 mg | ORAL_TABLET | Freq: Every day | ORAL | 1 refills | Status: AC

## 2021-03-28 MED ORDER — PREDNISONE 50 MG PO TABS: ORAL_TABLET | ORAL | 0 refills | Status: DC

## 2021-03-28 NOTE — ED Provider Notes (Signed)
[facility]    CSN: [id number] Arrival date & time: 03/28/21  1103      History   Chief Complaint Chief Complaint  Patient presents with   Sore Throat    HPI  20 year old female presents with sore throat.  Patient reports that she has had sore throat for the past week.  Patient states that she was recently diagnosed with genital herpes.  She is concerned that her sore throat is from HSV.  She was recently treated with Valtrex.  Patient reports that she has had difficulty swallowing both solids and liquids secondary to the pain.  She does tolerate warm liquids.  She feels like she has ulcerations in her throat.  Pain 6/10 in severity.  No relieving factors.  No fever.  No other complaints.  Past Medical History:  Diagnosis Date   Anxiety    Asthma    Depression     Patient Active Problem List   Diagnosis Date Noted   Retained products of conception, postpartum 11/21/2020    Past Surgical History:  Procedure Laterality Date   DILATION AND CURETTAGE OF UTERUS N/A 11/21/2020   Procedure: DILATATION AND CURETTAGE;  Surgeon: Conard Novak, MD;  Location: [facility] ORS;  Service: Gynecology;  Laterality: N/A;   IUD REMOVAL  11/21/2020   Procedure: incidental INTRAUTERINE DEVICE (IUD) REMOVAL;  Surgeon: Conard Novak, MD;  Location: [facility] OR[facility];  Service: Gynecology;;   TONSILLECTOMY     WISDOM TOOTH EXTRACTION      OB History     Gravida  1   Para  0   Term  0   Preterm  0   AB  0   Living  0      SAB  0   IAB  0   Ectopic  0   Multiple  0   Live Births  0            Home Medications    Prior to Admission medications   Medication Sig Start Date End Date Taking? Authorizing Provider  lidocaine (XYLOCAINE) 2 % solution Gargle 15 mL every 3 hours as needed. May swallow if desired. 03/28/21  Yes Perel Hauschild G, DO  metroNIDAZOLE (METROGEL) 0.75 % vaginal gel Place vaginally daily. 03/24/21  Yes [provider]  predniSONE  (DELTASONE) 50 MG tablet 1 tablet daily x 5 days 03/28/21  Yes Gautham Hewins G, DO  valACYclovir (VALTREX) 1000 MG tablet Take 1 tablet (1,000 mg total) by mouth daily for 5 days. 03/28/21 04/02/21  Tommie Sams, DO    Family History History reviewed. No pertinent family history.  Social History Social History   Tobacco Use   Smoking status: Never   Smokeless tobacco: Never  Vaping Use   Vaping Use: Every day   Last attempt to quit: 03/11/2020  Substance Use Topics   Alcohol use: Not Currently    Comment: last drink in 02/22/20   Drug use: Never     Allergies   Patient has no known allergies.   Review of Systems Review of Systems  Constitutional:  Negative for fever.  HENT:  Positive for sore throat and trouble swallowing.     Physical Exam Triage Vital Signs ED Triage Vitals  Enc Vitals Group     BP 03/28/21 1130 (!) 141/84     Pulse Rate 03/28/21 1130 79     Resp 03/28/21 1130 16     Temp 03/28/21 1130 98.8 F (37.1 C)  Temp Source 03/28/21 1130 Oral     SpO2 03/28/21 1130 100 %     Weight 03/28/21 1126 260 lb (117.9 kg)     Height 03/28/21 1126 5\' 7"  (1.702 m)     Head Circumference --      Peak Flow --      Pain Score 03/28/21 1126 6     Pain Loc --      Pain Edu? --      Excl. in GC? --     Updated Vital Signs BP (!) 141/84 (BP Location: Left Arm)   Pulse 79   Temp 98.8 F (37.1 C) (Oral)   Resp 16   Ht 5\' 7"  (1.702 m)   Wt 117.9 kg   SpO2 100%   Breastfeeding No   BMI 40.72 kg/m   Visual Acuity Right Eye Distance:   Left Eye Distance:   Bilateral Distance:    Right Eye Near:   Left Eye Near:    Bilateral Near:     Physical Exam Vitals and nursing note reviewed.  Constitutional:      General: She is not in acute distress.    Appearance: Normal appearance. She is obese.  HENT:     Head: Normocephalic and atraumatic.     Mouth/Throat:     Comments: Right tonsil with slight exudate.  Mild oropharyngeal erythema.  There are no appreciable  oral lesions. Cardiovascular:     Rate and Rhythm: Normal rate and regular rhythm.  Pulmonary:     Effort: Pulmonary effort is normal.     Breath sounds: Normal breath sounds. No wheezing or rales.  Neurological:     Mental Status: She is alert.  Psychiatric:        Mood and Affect: Mood normal.        Behavior: Behavior normal.     UC Treatments / Results  Labs (all labs ordered are listed, but only abnormal results are displayed) Labs Reviewed  CULTURE, GROUP A STREP [facility])  POCT RAPID STREP A, ED / UC  POCT RAPID STREP A    EKG   Radiology No results found.  Procedures Procedures (including critical care time)  Medications Ordered in UC Medications - No data to display  Initial Impression / Assessment and Plan / UC Course  I have reviewed the triage vital signs and the nursing notes.  Pertinent labs & imaging results that were available during my care of the patient were reviewed by me and considered in my medical decision making (see chart for details).    20 year old female presents with viral pharyngitis.  Patient is concerned about HSV.  I have no clinical evidence that she is experiencing HSV based off her exam findings.  Strep negative.  This is likely viral pharyngitis.  Placing on prednisone and viscous lidocaine.  I have refilled her Valtrex (for genital herpes).   Final Clinical Impressions(s) / UC Diagnoses   Final diagnoses:  Viral pharyngitis     Discharge Instructions      There is no clinical evidence of oral HSV.  Strep negative.  This is likely viral pharyngitis. I am awaiting the strep culture.  Medication as directed.   I have refilled valtrex if needed for additional outbreaks.   ED Prescriptions     Medication Sig Dispense Auth. Provider   valACYclovir (VALTREX) 1000 MG tablet Take 1 tablet (1,000 mg total) by mouth daily for 5 days. 5 tablet Alf Doyle G, DO   predniSONE (DELTASONE) 50  MG tablet 1 tablet daily x 5 days 5  tablet Motty Borin G, DO   lidocaine (XYLOCAINE) 2 % solution Gargle 15 mL every 3 hours as needed. May swallow if desired. 200 mL Tommie Sams, DO      PDMP not reviewed this encounter.   Tommie Sams, [place] 03/28/21 1241

## 2021-03-28 NOTE — Discharge Instructions (Addendum)
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  Strep negative.  This is likely viral pharyngitis. I am awaiting the strep culture.  Medication as directed.   I have refilled valtrex if needed for additional outbreaks.

## 2021-03-28 NOTE — ED Triage Notes (Signed)
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 States that she was placed on valcyclovir and flagyl. States that she called them and they told her to come here and be checked. Advised that we do not have capability today for those tests. States that she would like to be swabbed for strep throat.

## 2021-03-31 LAB — CULTURE, GROUP A STREP (THRC)

## 2021-09-11 DIAGNOSIS — Z975 Presence of (intrauterine) contraceptive device: Secondary | ICD-10-CM | POA: Insufficient documentation

## 2021-09-11 DIAGNOSIS — N898 Other specified noninflammatory disorders of vagina: Secondary | ICD-10-CM | POA: Insufficient documentation

## 2021-09-11 DIAGNOSIS — N8110 Cystocele, unspecified: Secondary | ICD-10-CM | POA: Insufficient documentation

## 2021-09-24 ENCOUNTER — Other Ambulatory Visit: Payer: Self-pay

## 2021-09-24 ENCOUNTER — Ambulatory Visit
Admission: EM | Admit: 2021-09-24 | Discharge: 2021-09-24 | Disposition: A | Discharge status: Home / Self Care | Payer: Medicaid Other | Attending: Physician Assistant | Admitting: Physician Assistant

## 2021-09-24 DIAGNOSIS — Z7951 Long term (current) use of inhaled steroids: Secondary | ICD-10-CM | POA: Insufficient documentation

## 2021-09-24 DIAGNOSIS — Z20822 Contact with and (suspected) exposure to covid-19: Secondary | ICD-10-CM | POA: Diagnosis not present

## 2021-09-24 DIAGNOSIS — R051 Acute cough: Secondary | ICD-10-CM

## 2021-09-24 DIAGNOSIS — B349 Viral infection, unspecified: Secondary | ICD-10-CM | POA: Diagnosis not present

## 2021-09-24 DIAGNOSIS — J45909 Unspecified asthma, uncomplicated: Secondary | ICD-10-CM | POA: Insufficient documentation

## 2021-09-24 DIAGNOSIS — R519 Headache, unspecified: Secondary | ICD-10-CM | POA: Diagnosis not present

## 2021-09-24 DIAGNOSIS — R0981 Nasal congestion: Secondary | ICD-10-CM

## 2021-09-24 LAB — RESP PANEL BY RT-PCR (FLU A&B, COVID) ARPGX2
Influenza A by PCR: NEGATIVE
Influenza B by PCR: NEGATIVE
SARS Coronavirus 2 by RT PCR: NEGATIVE

## 2021-09-24 MED ORDER — PROMETHAZINE-DM 6.25-15 MG/5ML PO SYRP: 5.0000 mL | ORAL_SOLUTION | Freq: Four times a day (QID) | ORAL | 0 refills | Status: DC | PRN

## 2021-09-24 MED ORDER — IBUPROFEN 800 MG PO TABS: 800.0000 mg | ORAL_TABLET | Freq: Three times a day (TID) | ORAL | 0 refills | Status: DC | PRN

## 2021-09-24 NOTE — ED Triage Notes (Signed)
Pt was sent home from work and need a work note. Pt c/o nausea, headache, nasal congestion, sneezing, coughing, nasal drainage x4-5days. Pt asks to be tested for flu and covid.

## 2021-09-24 NOTE — Discharge Instructions (Signed)

## 2021-09-24 NOTE — ED Provider Notes (Signed)
[facility]    CSN: [id number] Arrival date & time: 09/24/21  0258      History   Chief Complaint Chief Complaint  Patient presents with   Cough    HPI Melanie Vasquez is a 20 y.o. female presenting for 4-day history of fever up to 101 degrees, fatigue, cough, congestion, headaches, sneezing, postnasal drainage, nausea, 1 episode of vomiting, 1 episode of diarrhea.  Also reports a little bit increase in breathing reported.  Has asthma and has been using her inhaler more than normal.  Patient denies any known COVID or flu exposure but would like to be tested.  She was sent home from work today due to her symptoms.  She has been taking over-the-counter Sudafed but states it has not really helped.  No other complaints.  HPI  Past Medical History:  Diagnosis Date   Anxiety    Asthma    Depression     Patient Active Problem List   Diagnosis Date Noted   Retained products of conception, postpartum 11/21/2020    Past Surgical History:  Procedure Laterality Date   DILATION AND CURETTAGE OF UTERUS N/A 11/21/2020   Procedure: DILATATION AND CURETTAGE;  Surgeon: Conard Novak, MD;  Location: [facility] OR[facility];  Service: Gynecology;  Laterality: N/A;   IUD REMOVAL  11/21/2020   Procedure: incidental INTRAUTERINE DEVICE (IUD) REMOVAL;  Surgeon: Conard Novak, MD;  Location: [facility];  Service: Gynecology;;   TONSILLECTOMY     WISDOM TOOTH EXTRACTION      OB History     Gravida  1   Para  0   Term  0   Preterm  0   AB  0   Living  0      SAB  0   IAB  0   Ectopic  0   Multiple  0   Live Births  0            Home Medications    Prior to Admission medications   Medication Sig Start Date End Date Taking? Authorizing Provider  albuterol (VENTOLIN HFA) 108 (90 Base) MCG/ACT inhaler Inhale into the lungs.   Yes [provider]  ibuprofen (ADVIL) 800 MG tablet Take 1 tablet (800 mg total) by mouth every 8 (eight) hours as needed for  headache. 09/24/21  Yes Shirlee Latch, PA-C  promethazine-dextromethorphan (PROMETHAZINE-DM) 6.25-15 MG/5ML syrup Take 5 mLs by mouth 4 (four) times daily as needed. 09/24/21  Yes Shirlee Latch, PA-C  valACYclovir (VALTREX) 500 MG tablet Take 500 mg by mouth 2 (two) times daily as needed. 08/24/21  Yes [provider]    Family History History reviewed. No pertinent family history.  Social History Social History   Tobacco Use   Smoking status: Never   Smokeless tobacco: Never  Vaping Use   Vaping Use: Every day   Last attempt to quit: 03/11/2020  Substance Use Topics   Alcohol use: Not Currently    Comment: last drink in 02/22/20   Drug use: Never     Allergies   Patient has no known allergies.   Review of Systems Review of Systems  Constitutional:  Positive for fatigue and fever. Negative for chills and diaphoresis.  HENT:  Positive for congestion, rhinorrhea and sneezing. Negative for ear pain, sinus pressure, sinus pain and sore throat.   Respiratory:  Positive for cough and shortness of breath. Negative for wheezing.   Cardiovascular:  Negative for chest pain.  Gastrointestinal:  Positive  for diarrhea, nausea and vomiting. Negative for abdominal pain.  Musculoskeletal:  Positive for myalgias. Negative for arthralgias.  Skin:  Negative for rash.  Neurological:  Positive for light-headedness and headaches. Negative for weakness.  Hematological:  Negative for adenopathy.    Physical Exam Triage Vital Signs ED Triage Vitals  Enc Vitals Group     BP 09/24/21 1106 137/65     Pulse Rate 09/24/21 1106 72     Resp 09/24/21 1106 18     Temp 09/24/21 1106 98.7 F (37.1 C)     Temp Source 09/24/21 1106 Oral     SpO2 09/24/21 1106 100 %     Weight 09/24/21 1107 270 lb (122.5 kg)     Height 09/24/21 1107 5\' 6"  (1.676 m)     Head Circumference --      Peak Flow --      Pain Score 09/24/21 1107 10     Pain Loc --      Pain Edu? --      Excl. in GC? --    No  data found.  Updated Vital Signs BP 137/65 (BP Location: Left Arm)    Pulse 72    Temp 98.7 F (37.1 C) (Oral)    Resp 18    Ht 5\' 6"  (1.676 m)    Wt 270 lb (122.5 kg)    SpO2 100%    BMI 43.58 kg/m     Physical Exam Vitals and nursing note reviewed.  Constitutional:      General: She is not in acute distress.    Appearance: Normal appearance. She is ill-appearing. She is not toxic-appearing.  HENT:     Head: Normocephalic and atraumatic.     Nose: Congestion and rhinorrhea present.     Mouth/Throat:     Mouth: Mucous membranes are moist.     Pharynx: Oropharynx is clear.  Eyes:     General: No scleral icterus.       Right eye: No discharge.        Left eye: No discharge.     Conjunctiva/sclera: Conjunctivae normal.  Cardiovascular:     Rate and Rhythm: Normal rate and regular rhythm.     Heart sounds: Normal heart sounds.  Pulmonary:     Effort: Pulmonary effort is normal. No respiratory distress.     Breath sounds: Normal breath sounds. No wheezing, rhonchi or rales.  Musculoskeletal:     Cervical back: Neck supple.  Skin:    General: Skin is dry.  Neurological:     General: No focal deficit present.     Mental Status: She is alert. Mental status is at baseline.     Motor: No weakness.     Gait: Gait normal.  Psychiatric:        Mood and Affect: Mood normal.        Behavior: Behavior normal.        Thought Content: Thought content normal.     UC Treatments / Results  Labs (all labs ordered are listed, but only abnormal results are displayed) Labs Reviewed  RESP PANEL BY RT-PCR (FLU A&B, COVID) [id number]    EKG   Radiology No results found.  Procedures Procedures (including critical care time)  Medications Ordered in UC Medications - No data to display  Initial Impression / Assessment and Plan / UC Course  I have reviewed the triage vital signs and the nursing notes.  Pertinent labs & imaging results that were available during my care  of the patient  were reviewed by me and considered in my medical decision making (see chart for details).  20 year old female presenting for 4-day history of fever, fatigue, cough, congestion, headaches, lightheadedness, nausea with vomiting, diarrhea.  Also increase shortness of breath.  History of asthma. Exam vitals all normal and stable.  She has mildly ill-appearing but nontoxic.  Exam significant for nasal congestion and clear drainage.  Chest clear to auscultation heart regular rate and rhythm.  Respiratory panel obtained to assess for flu and COVID.  All negative.  Discussed results with patient.  Advised patient symptoms consistent with viral illness.  Supportive care encouraged with increasing rest and fluids.  Sent Promethazine DM to pharmacy and ibuprofen for headache.  Advised her that she should feel better within 7 to 10 days since most people do with upper respiratory infections.  I did offer her nausea medicine but she declined.  Continue use inhaler as needed for any shortness of breath.  Oxygen is 100% today and her chest is clear.  If breathing worsens she should be seen again.  ED precautions reviewed.  Work note provided.   Final Clinical Impressions(s) / UC Diagnoses   Final diagnoses:  Viral illness  Acute cough  Acute nonintractable headache, unspecified headache type  Nasal congestion     Discharge Instructions      URI/COLD SYMPTOMS: Your exam today is consistent with a viral illness. Antibiotics are not indicated at this time. Use medications as directed, including cough syrup, nasal saline, and decongestants. Your symptoms should improve over the next few days and resolve within 7-10 days. Increase rest and fluids. F/u if symptoms worsen or predominate such as sore throat, ear pain, productive cough, shortness of breath, or if you develop high fevers or worsening fatigue over the next several days.    You have received COVID testing today either for positive exposure, concerning  symptoms that could be related to COVID infection, screening purposes, or re-testing after confirmed positive.  Your test obtained today checks for active viral infection in the last 1-2 weeks. If your test is negative now, you can still test positive later. So, if you do develop symptoms you should either get re-tested and/or isolate x 5 days and then strict mask use x 5 days (unvaccinated) or mask use x 10 days (vaccinated). Please follow CDC guidelines.  While Rapid antigen tests come back in 15-20 minutes, send out PCR/molecular test results typically come back within 1-3 days. In the mean time, if you are symptomatic, assume this could be a positive test and treat/monitor yourself as if you do have COVID.   We will call with test results if positive. Please download the MyChart app and set up a profile to access test results.   If symptomatic, go home and rest. Push fluids. Take Tylenol as needed for discomfort. Gargle warm salt water. Throat lozenges. Take Mucinex DM or Robitussin for cough. Humidifier in bedroom to ease coughing. Warm showers. Also review the COVID handout for more information.  COVID-19 INFECTION: The incubation period of COVID-19 is approximately 14 days after exposure, with most symptoms developing in roughly 4-5 days. Symptoms may range in severity from mild to critically severe. Roughly 80% of those infected will have mild symptoms. People of any age may become infected with COVID-19 and have the ability to transmit the virus. The most common symptoms include: fever, fatigue, cough, body aches, headaches, sore throat, nasal congestion, shortness of breath, nausea, vomiting, diarrhea, changes in smell and/or  taste.    COURSE OF ILLNESS Some patients may begin with mild disease which can progress quickly into critical symptoms. If your symptoms are worsening please call ahead to the Emergency Department and proceed there for further treatment. Recovery time appears to be roughly  1-2 weeks for mild symptoms and 3-6 weeks for severe disease.   GO IMMEDIATELY TO ER FOR FEVER YOU ARE UNABLE TO GET DOWN WITH TYLENOL, BREATHING PROBLEMS, CHEST PAIN, FATIGUE, LETHARGY, INABILITY TO EAT OR DRINK, ETC  QUARANTINE AND ISOLATION: To help decrease the spread of COVID-19 please remain isolated if you have COVID infection or are highly suspected to have COVID infection. This means -stay home and isolate to one room in the home if you live with others. Do not share a bed or bathroom with others while ill, sanitize and wipe down all countertops and keep common areas clean and disinfected. Stay home for 5 days. If you have no symptoms or your symptoms are resolving after 5 days, you can leave your house. Continue to wear a mask around others for 5 additional days. If you have been in close contact (within 6 feet) of someone diagnosed with COVID 19, you are advised to quarantine in your home for 14 days as symptoms can develop anywhere from 2-14 days after exposure to the virus. If you develop symptoms, you  must isolate.  Most current guidelines for COVID after exposure -unvaccinated: isolate 5 days and strict mask use x 5 days. Test on day 5 is possible -vaccinated: wear mask x 10 days if symptoms do not develop -You do not necessarily need to be tested for COVID if you have + exposure and  develop symptoms. Just isolate at home x10 days from symptom onset During this global pandemic, CDC advises to practice social distancing, try to stay at least 54ft away from others at all times. Wear a face covering. Wash and sanitize your hands regularly and avoid going anywhere that is not necessary.  KEEP IN MIND THAT THE COVID TEST IS NOT 100% ACCURATE AND YOU SHOULD STILL DO EVERYTHING TO PREVENT POTENTIAL SPREAD OF VIRUS TO OTHERS (WEAR MASK, WEAR GLOVES, WASH HANDS AND SANITIZE REGULARLY). IF INITIAL TEST IS NEGATIVE, THIS MAY NOT MEAN YOU ARE DEFINITELY NEGATIVE. MOST ACCURATE TESTING IS DONE 5-7  DAYS AFTER EXPOSURE.   It is not advised by CDC to get re-tested after receiving a positive COVID test since you can still test positive for weeks to months after you have already cleared the virus.   *If you have not been vaccinated for COVID, I strongly suggest you consider getting vaccinated as long as there are no contraindications.       ED Prescriptions     Medication Sig Dispense Auth. Provider   promethazine-dextromethorphan (PROMETHAZINE-DM) 6.25-15 MG/5ML syrup Take 5 mLs by mouth 4 (four) times daily as needed. 118 mL Eusebio Friendly B, PA-C   ibuprofen (ADVIL) 800 MG tablet Take 1 tablet (800 mg total) by mouth every 8 (eight) hours as needed for headache. 21 tablet Shirlee Latch, PA-C      PDMP not reviewed this encounter.   Shirlee Latch, PA-C 09/24/21 1158

## 2021-12-15 IMAGING — US US PELVIS COMPLETE
1 series · 14 of 25 positions shown · non-contrast
Comparison: Ultrasound 11/21/2020

CLINICAL DATA: Vaginal bleeding. History of vaginal delivery on
11/15/2020

EXAM:
TRANSABDOMINAL ULTRASOUND OF PELVIS
TECHNIQUE: Transabdominal ultrasound examination of the pelvis was performed
including evaluation of the uterus, ovaries, adnexal regions, and
pelvic cul-de-sac.

[Series 1: us pelvic complete with transvaginal · 82 acquisitions, 14 frames shown]
[im 1/82]
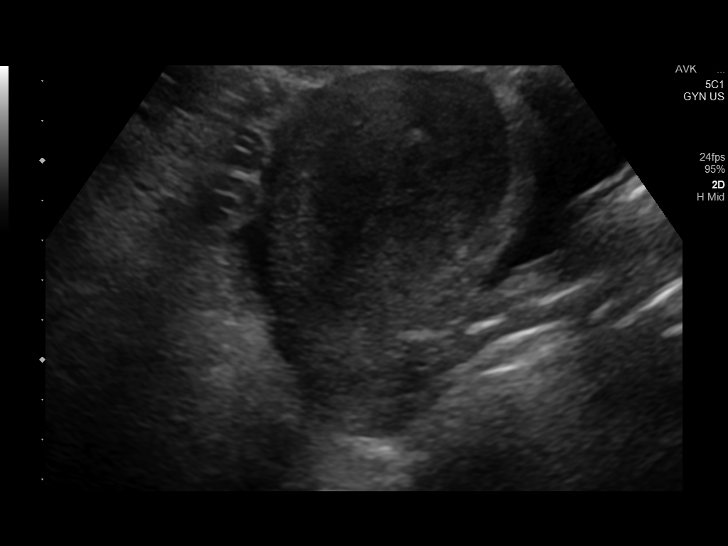
[im 7/82]
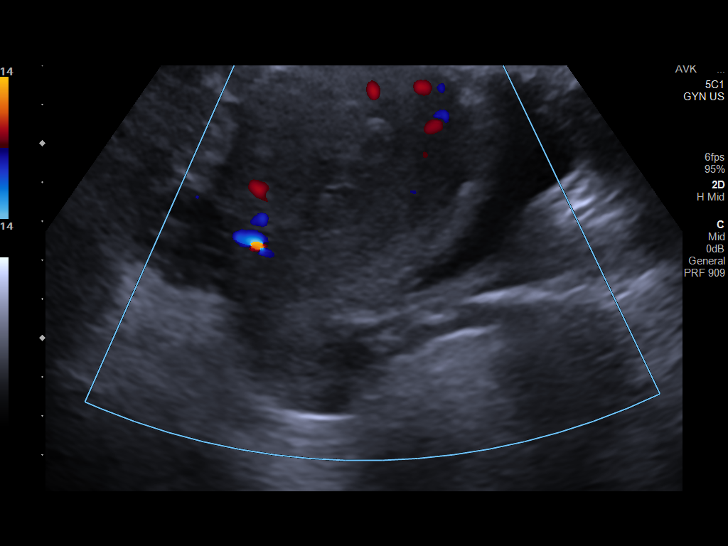
[im 14/82]
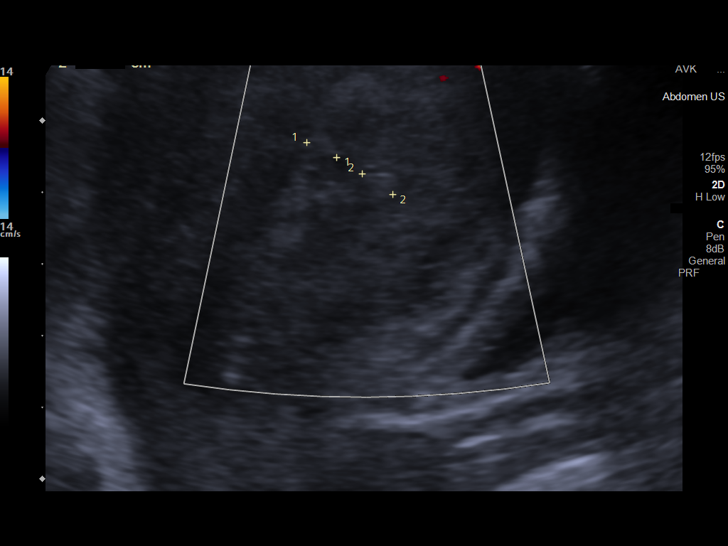
[im 21/82]
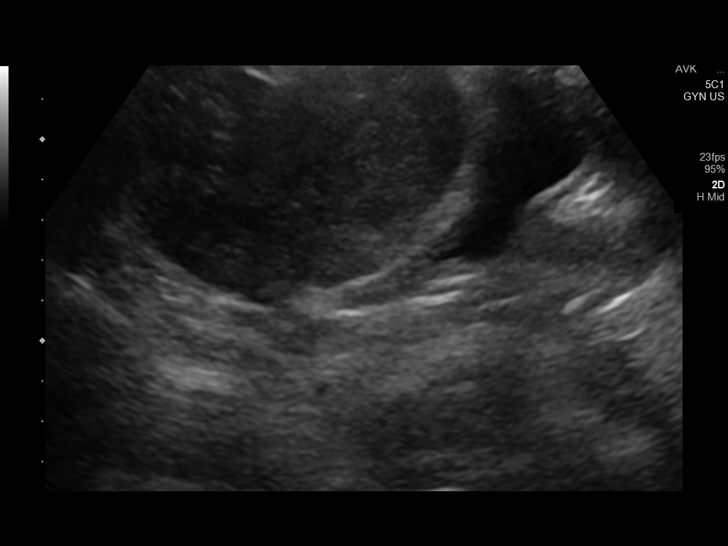
[im 28/82]
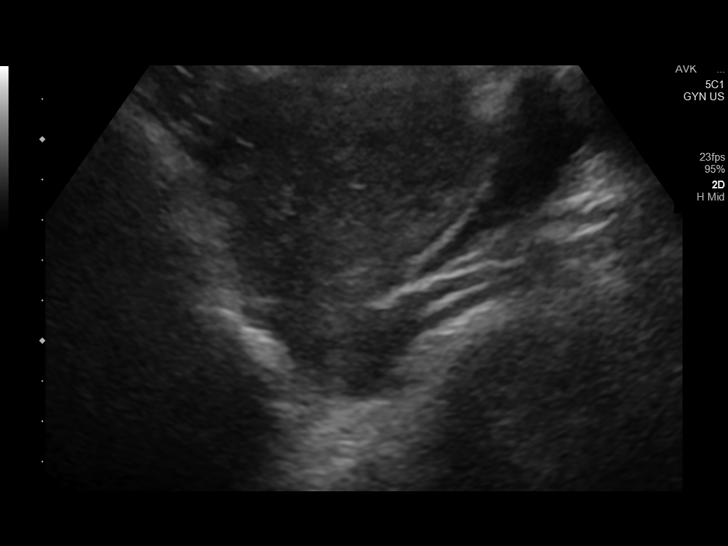
[im 31/82]
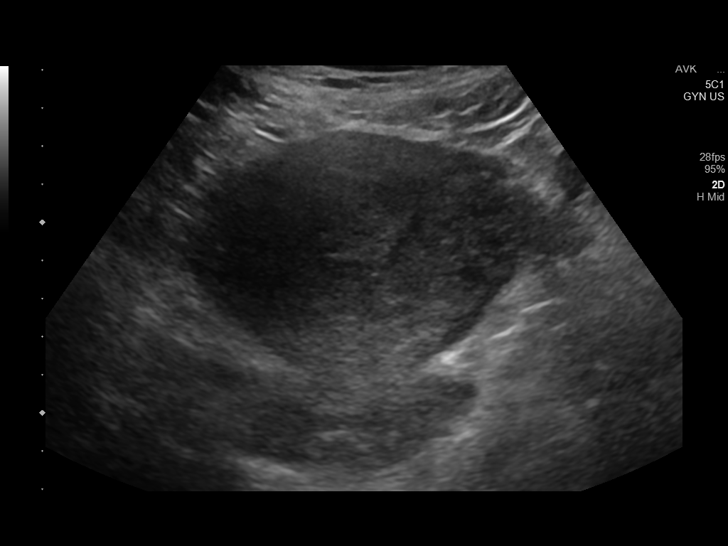
[im 38/82]
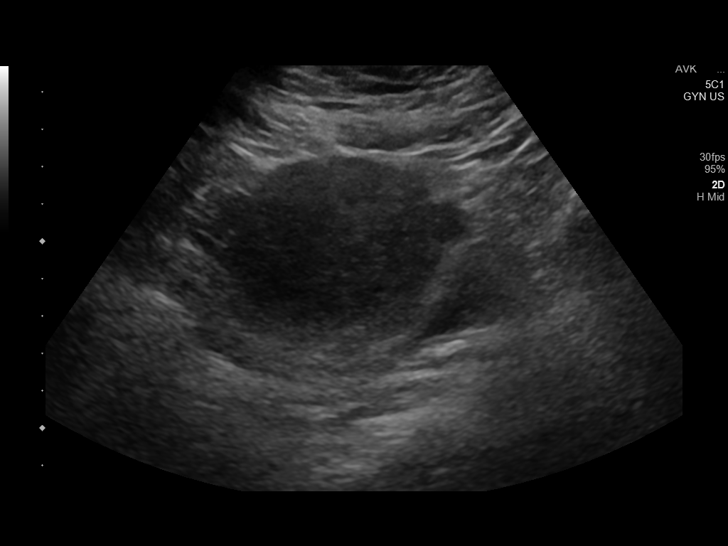
[im 44/82]
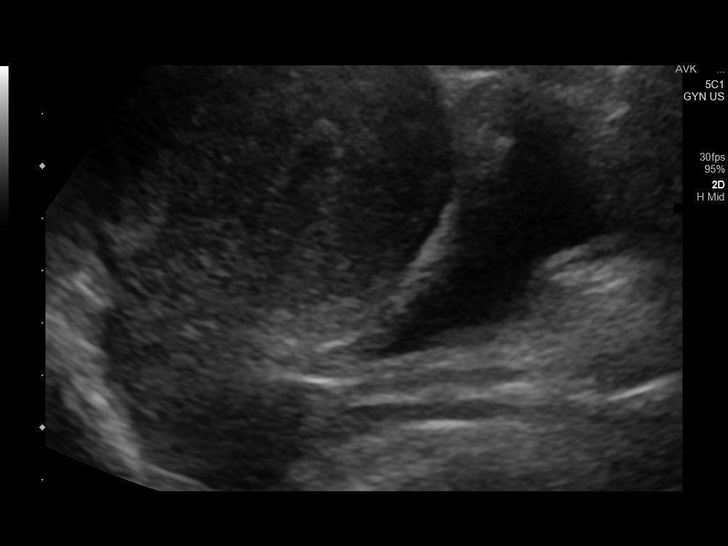
[im 51/82]
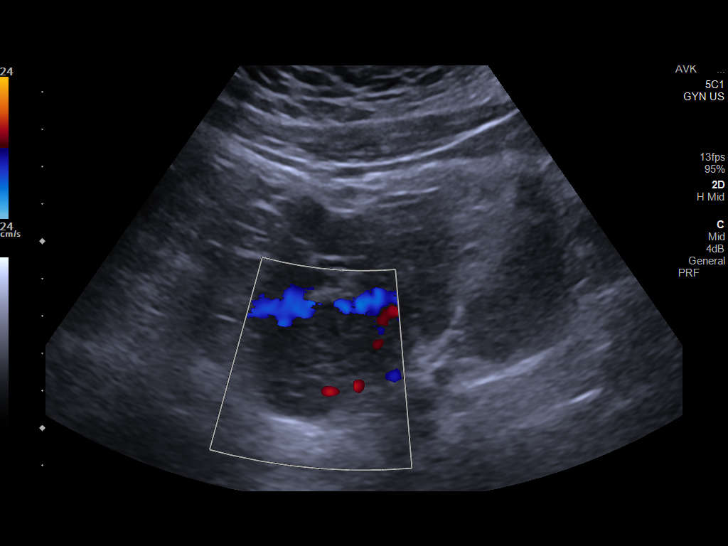
[im 55/82]
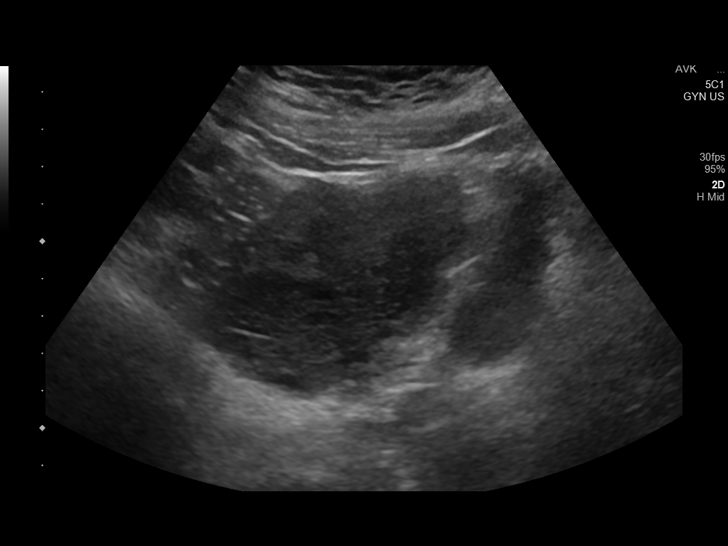
[im 61/82]
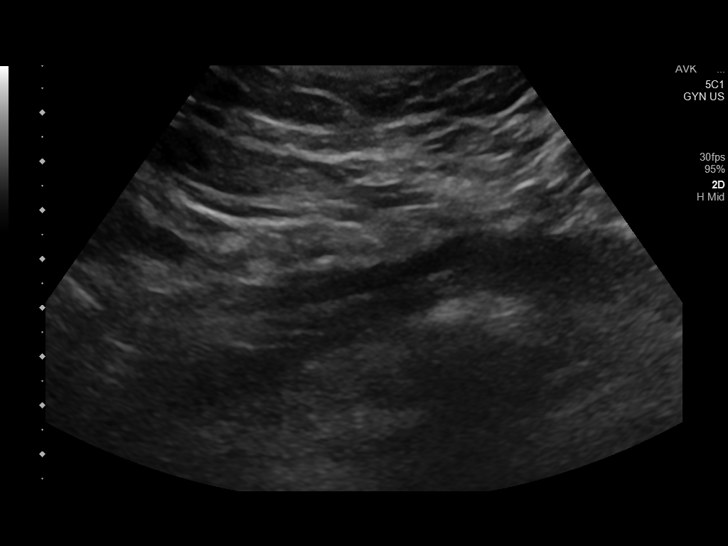
[im 68/82]
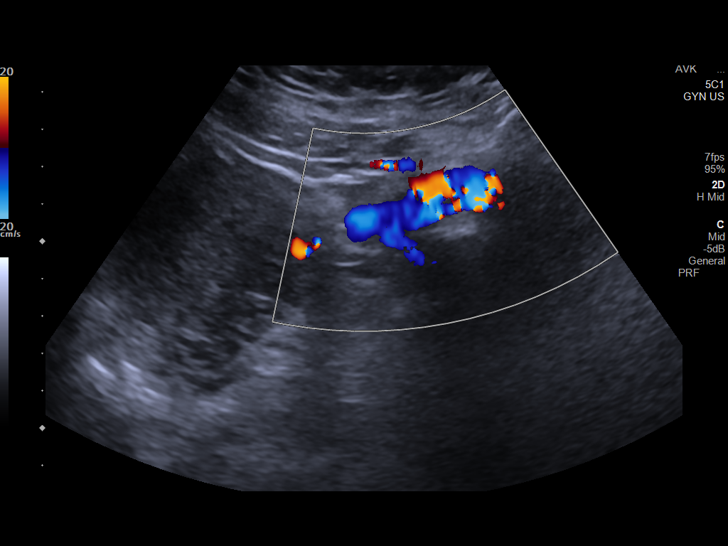
[im 75/82]
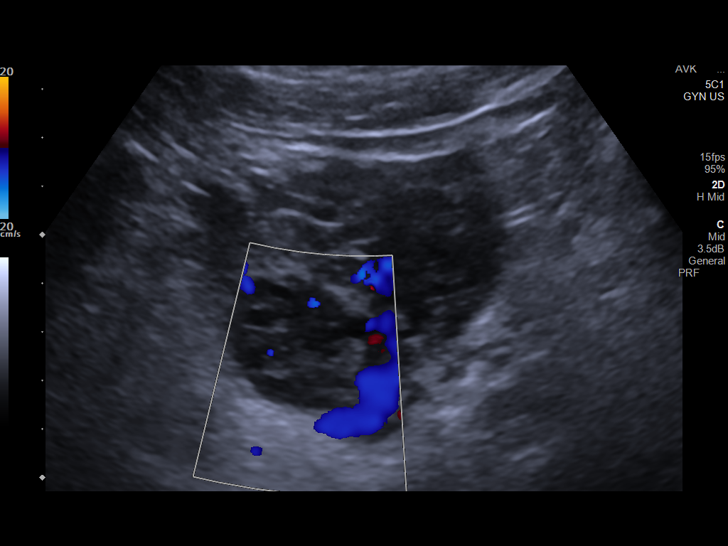
[im 82/82]
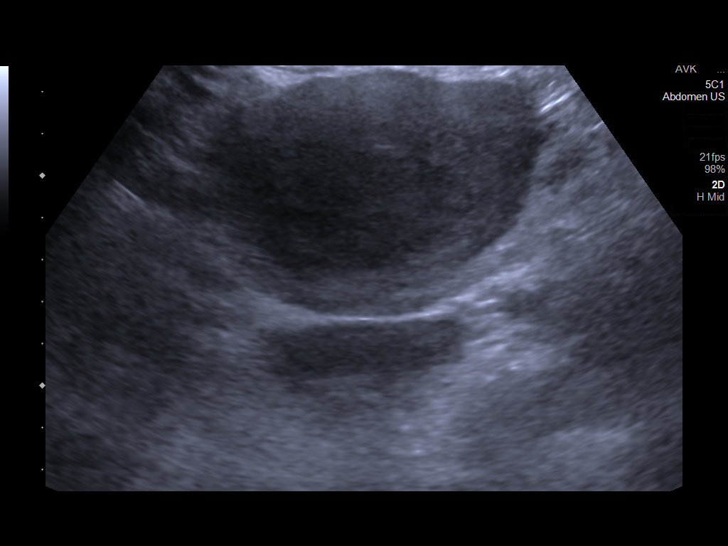

[14 of 25 positions shown; findings below may reference images not displayed]

FINDINGS: Uterus

Measurements: 9.4 x 5.5 x 7.9 cm = volume: 215 mL. No fibroids or
other mass visualized.

Endometrium

Thickness: 9 mm. Small amount of fluid in the endometrial canal. No
focal abnormality visualized. No increased vascularity.

Right ovary

Measurements: 3.9 x 3.6 x 3.5 cm = volume: 26 mL. Normal
appearance/no adnexal mass.

Left ovary

Measurements: 3.3 x 2.5 x 2.3 cm = volume: 10 mL. Normal
appearance/no adnexal mass.

Other findings:  No abnormal free fluid.
IMPRESSION: 1. Small amount of fluid in the endometrial canal. No focal
abnormality or increased vascularity to suggest retained products of
conception.
2. Otherwise unremarkable transabdominal ultrasound of the uterus
and ovaries.

## 2022-01-11 DIAGNOSIS — R102 Pelvic and perineal pain unspecified side: Secondary | ICD-10-CM | POA: Insufficient documentation

## 2022-01-27 ENCOUNTER — Emergency Department
Admission: EM | Admit: 2022-01-27 | Discharge: 2022-01-27 | Disposition: A | Discharge status: Home / Self Care | Payer: Medicaid Other | Attending: Emergency Medicine | Admitting: Emergency Medicine

## 2022-01-27 ENCOUNTER — Emergency Department: Payer: Medicaid Other

## 2022-01-27 ENCOUNTER — Ambulatory Visit
Admission: EM | Admit: 2022-01-27 | Discharge: 2022-01-27 | Disposition: A | Discharge status: Home / Self Care | Payer: Medicaid Other | Attending: Physician Assistant | Admitting: Physician Assistant

## 2022-01-27 ENCOUNTER — Other Ambulatory Visit: Payer: Self-pay

## 2022-01-27 DIAGNOSIS — R7401 Elevation of levels of liver transaminase levels: Secondary | ICD-10-CM | POA: Insufficient documentation

## 2022-01-27 DIAGNOSIS — K810 Acute cholecystitis: Secondary | ICD-10-CM | POA: Insufficient documentation

## 2022-01-27 DIAGNOSIS — K859 Acute pancreatitis without necrosis or infection, unspecified: Secondary | ICD-10-CM | POA: Insufficient documentation

## 2022-01-27 DIAGNOSIS — K529 Noninfective gastroenteritis and colitis, unspecified: Secondary | ICD-10-CM | POA: Insufficient documentation

## 2022-01-27 DIAGNOSIS — N200 Calculus of kidney: Secondary | ICD-10-CM | POA: Diagnosis not present

## 2022-01-27 DIAGNOSIS — K760 Fatty (change of) liver, not elsewhere classified: Secondary | ICD-10-CM | POA: Insufficient documentation

## 2022-01-27 DIAGNOSIS — R509 Fever, unspecified: Secondary | ICD-10-CM | POA: Insufficient documentation

## 2022-01-27 DIAGNOSIS — R1031 Right lower quadrant pain: Secondary | ICD-10-CM | POA: Diagnosis not present

## 2022-01-27 DIAGNOSIS — Z20822 Contact with and (suspected) exposure to covid-19: Secondary | ICD-10-CM | POA: Insufficient documentation

## 2022-01-27 DIAGNOSIS — R000 Tachycardia, unspecified: Secondary | ICD-10-CM | POA: Diagnosis not present

## 2022-01-27 DIAGNOSIS — R160 Hepatomegaly, not elsewhere classified: Secondary | ICD-10-CM | POA: Diagnosis not present

## 2022-01-27 DIAGNOSIS — R112 Nausea with vomiting, unspecified: Secondary | ICD-10-CM

## 2022-01-27 DIAGNOSIS — K56609 Unspecified intestinal obstruction, unspecified as to partial versus complete obstruction: Secondary | ICD-10-CM | POA: Insufficient documentation

## 2022-01-27 DIAGNOSIS — E860 Dehydration: Secondary | ICD-10-CM | POA: Insufficient documentation

## 2022-01-27 DIAGNOSIS — R197 Diarrhea, unspecified: Secondary | ICD-10-CM | POA: Diagnosis not present

## 2022-01-27 DIAGNOSIS — N10 Acute tubulo-interstitial nephritis: Secondary | ICD-10-CM | POA: Diagnosis not present

## 2022-01-27 LAB — CBC WITH DIFFERENTIAL/PLATELET
Abs Immature Granulocytes: 0.05 10*3/uL (ref 0.00–0.07)
Basophils Absolute: 0 10*3/uL (ref 0.0–0.1)
Basophils Relative: 0 %
Eosinophils Absolute: 0.1 10*3/uL (ref 0.0–0.5)
Eosinophils Relative: 1 %
HCT: 42.9 % (ref 36.0–46.0)
Hemoglobin: 14.1 g/dL (ref 12.0–15.0)
Immature Granulocytes: 1 %
Lymphocytes Relative: 11 %
Lymphs Abs: 0.9 10*3/uL (ref 0.7–4.0)
MCH: 28.7 pg (ref 26.0–34.0)
MCHC: 32.9 g/dL (ref 30.0–36.0)
MCV: 87.4 fL (ref 80.0–100.0)
Monocytes Absolute: 0.6 10*3/uL (ref 0.1–1.0)
Monocytes Relative: 7 %
Neutro Abs: 7.2 10*3/uL (ref 1.7–7.7)
Neutrophils Relative %: 80 %
Platelets: 226 10*3/uL (ref 150–400)
RBC: 4.91 MIL/uL (ref 3.87–5.11)
RDW: 12.5 % (ref 11.5–15.5)
WBC: 8.8 10*3/uL (ref 4.0–10.5)
nRBC: 0 % (ref 0.0–0.2)

## 2022-01-27 LAB — LIPASE, BLOOD: Lipase: 28 U/L (ref 11–51)

## 2022-01-27 LAB — RESP PANEL BY RT-PCR (FLU A&B, COVID) ARPGX2
Influenza A by PCR: NEGATIVE
Influenza B by PCR: NEGATIVE
SARS Coronavirus 2 by RT PCR: NEGATIVE

## 2022-01-27 LAB — URINALYSIS, ROUTINE W REFLEX MICROSCOPIC
Bilirubin Urine: NEGATIVE
Glucose, UA: NEGATIVE mg/dL
Hgb urine dipstick: NEGATIVE
Ketones, ur: NEGATIVE mg/dL
Leukocytes,Ua: NEGATIVE
Nitrite: NEGATIVE
Protein, ur: NEGATIVE mg/dL
Specific Gravity, Urine: 1.02 (ref 1.005–1.030)
pH: 5 (ref 5.0–8.0)

## 2022-01-27 LAB — COMPREHENSIVE METABOLIC PANEL
ALT: 274 U/L — ABNORMAL HIGH (ref 0–44)
AST: 133 U/L — ABNORMAL HIGH (ref 15–41)
Albumin: 4.3 g/dL (ref 3.5–5.0)
Alkaline Phosphatase: 54 U/L (ref 38–126)
Anion gap: 9 (ref 5–15)
BUN: 13 mg/dL (ref 6–20)
CO2: 23 mmol/L (ref 22–32)
Calcium: 8.6 mg/dL — ABNORMAL LOW (ref 8.9–10.3)
Chloride: 105 mmol/L (ref 98–111)
Creatinine, Ser: 0.79 mg/dL (ref 0.44–1.00)
GFR, Estimated: >60 mL/min (ref >60)
Glucose, Bld: 101 mg/dL — ABNORMAL HIGH (ref 70–99)
Potassium: 3.7 mmol/L (ref 3.5–5.1)
Sodium: 137 mmol/L (ref 135–145)
Total Bilirubin: 1.1 mg/dL (ref 0.3–1.2)
Total Protein: 7.9 g/dL (ref 6.5–8.1)

## 2022-01-27 LAB — LACTIC ACID, PLASMA
Lactic Acid, Venous: 2 mmol/L (ref 0.5–1.9)
Lactic Acid, Venous: 1.2 mmol/L (ref 0.5–1.9)

## 2022-01-27 LAB — POC URINE PREG, ED: Preg Test, Ur: NEGATIVE

## 2022-01-27 MED ORDER — ONDANSETRON HCL 4 MG/2ML IJ SOLN
4.0000 mg | Freq: Once | INTRAMUSCULAR | Status: AC
  Administered 2022-01-27: 4 mg via INTRAVENOUS
  Filled 2022-01-27: qty 2

## 2022-01-27 MED ORDER — ONDANSETRON 4 MG PO TBDP: 4.0000 mg | ORAL_TABLET | Freq: Three times a day (TID) | ORAL | 0 refills | Status: DC | PRN

## 2022-01-27 MED ORDER — DIPHENHYDRAMINE HCL 50 MG/ML IJ SOLN: 25.0000 mg | Freq: Once | INTRAMUSCULAR | Status: DC

## 2022-01-27 MED ORDER — IBUPROFEN 600 MG PO TABS
600.0000 mg | ORAL_TABLET | Freq: Three times a day (TID) | ORAL | 0 refills | Status: DC | PRN
Start: 2022-01-27 — End: 2023-06-08

## 2022-01-27 MED ORDER — ACETAMINOPHEN 325 MG PO TABS
650.0000 mg | ORAL_TABLET | Freq: Once | ORAL | Status: AC
  Administered 2022-01-27: 650 mg via ORAL

## 2022-01-27 MED ORDER — PROMETHAZINE HCL 25 MG RE SUPP: 25.0000 mg | Freq: Three times a day (TID) | RECTAL | 0 refills | Status: DC | PRN

## 2022-01-27 MED ORDER — SODIUM CHLORIDE 0.9 % IV BOLUS
1000.0000 mL | Freq: Once | INTRAVENOUS | Status: DC
Start: 2022-01-27 — End: 2022-01-28

## 2022-01-27 MED ORDER — MORPHINE SULFATE (PF) 4 MG/ML IV SOLN
4.0000 mg | Freq: Once | INTRAVENOUS | Status: AC
  Administered 2022-01-27: 4 mg via INTRAVENOUS
  Filled 2022-01-27: qty 1

## 2022-01-27 MED ORDER — METOCLOPRAMIDE HCL 5 MG/ML IJ SOLN: 10.0000 mg | Freq: Once | INTRAMUSCULAR | Status: DC

## 2022-01-27 MED ORDER — IOHEXOL 300 MG/ML  SOLN
100.0000 mL | Freq: Once | INTRAMUSCULAR | Status: AC | PRN
  Administered 2022-01-27: 100 mL via INTRAVENOUS

## 2022-01-27 MED ORDER — SODIUM CHLORIDE 0.9 % IV BOLUS
1000.0000 mL | Freq: Once | INTRAVENOUS | Status: AC
  Administered 2022-01-27: 1000 mL via INTRAVENOUS

## 2022-01-27 MED ORDER — LACTATED RINGERS IV BOLUS
1000.0000 mL | Freq: Once | INTRAVENOUS | Status: AC
  Administered 2022-01-27: 1000 mL via INTRAVENOUS

## 2022-01-27 MED ORDER — KETOROLAC TROMETHAMINE 30 MG/ML IJ SOLN
15.0000 mg | Freq: Once | INTRAMUSCULAR | Status: AC
  Administered 2022-01-27: 15 mg via INTRAVENOUS
  Filled 2022-01-27: qty 1

## 2022-01-27 NOTE — ED Provider Notes (Signed)
? ?[facility] ?Provider Note ? ? ? Event Date/Time  ? First MD Initiated Contact with Patient 01/27/22 2001   ?  (approximate) ? ? ?History  ? ?Abdominal Pain ? ? ?HPI ? ?Melanie Vasquez is a 21 y.o. female here with fever, chills, abdominal pain, nausea, vomiting, diarrhea.  The patient states she was in her usual state of health yesterday.  She woke up today and had chills, fever, and fatigue.  She has diffuse body aches.  She also then became nauseous and began vomiting.  She has had loose stools as well.  She went to urgent care and was sent here for possible appendicitis.  She states that she feels mildly improved after receiving fever control, but still feels nauseous.  Pain is diffuse, constant but intermittently cramp-like.  She has had persistent vomiting has been unable to keep anything down.  Her diarrhea has been loose and watery.  No blood in her stool.  No blood in her emesis.  No vaginal bleeding or discharge.  No urinary symptoms.   ?  ? ? ?Physical Exam  ? ?Triage Vital Signs: ?ED Triage Vitals  ?Enc Vitals Group  ?   BP 01/27/22 1733 139/71  ?   Pulse Rate 01/27/22 1733 (!) 116  ?   Resp 01/27/22 1733 16  ?   Temp 01/27/22 1733 (!) 100.5 ?F (38.1 ?C)  ?   Temp Source 01/27/22 1733 Oral  ?   SpO2 01/27/22 1733 97 %  ?   Weight 01/27/22 1734 270 lb (122.5 kg)  ?   Height 01/27/22 1734 5\' 7"  (1.702 m)  ?   Head Circumference --   ?   Peak Flow --   ?   Pain Score 01/27/22 1734 9  ?   Pain Loc --   ?   Pain Edu? --   ?   Excl. in GC? --   ? ? ?Most recent vital signs: ?Vitals:  ? 01/27/22 2239 01/27/22 2243  ?BP:  115/69  ?Pulse: 90 90  ?Resp:  16  ?Temp:  99.5 ?F (37.5 ?C)  ?SpO2: 100% 100%  ? ? ? ?General: Awake, no distress.  ?CV:  Good peripheral perfusion.  Mild tachycardia.  No murmurs. ?Resp:  Normal effort.  Lungs clear. ?Abd:  No distention.  Mild epigastric and diffuse right-sided tenderness.  No overt CVA tenderness.  No guarding or rebound.  No  peritonitis. ?Other:  Mildly dry mucous membranes. ? ? ?ED Results / Procedures / Treatments  ? ?Labs ?(all labs ordered are listed, but only abnormal results are displayed) ?Labs Reviewed  ?COMPREHENSIVE METABOLIC PANEL - Abnormal; Notable for the following components:  ?    Result Value  ? Glucose, Bld 101 (*)   ? Calcium 8.6 (*)   ? AST 133 (*)   ? ALT 274 (*)   ? All other components within normal limits  ?LACTIC ACID, PLASMA - Abnormal; Notable for the following components:  ? Lactic Acid, Venous 2.0 (*)   ? All other components within normal limits  ?LIPASE, BLOOD  ?CBC WITH DIFFERENTIAL/PLATELET  ?LACTIC ACID, PLASMA  ?POC URINE PREG, ED  ? ? ? ?EKG ? ? ? ?RADIOLOGY ?CT: No obstruction, no pneumoperitoneum, enlarged fatty liver, small amount of free fluid in the cul-de-sac ? ? ?I also independently reviewed and agree with radiologist interpretations. ? ? ?PROCEDURES: ? ?Critical Care performed: No ? ? ?MEDICATIONS ORDERED IN ED: ?Medications  ?sodium chloride 0.9 % bolus 1,000  mL (1,000 mLs Intravenous Patient Refused/Not Given 01/27/22 2242)  ?sodium chloride 0.9 % bolus 1,000 mL (0 mLs Intravenous Stopped 01/27/22 2101)  ?iohexol (OMNIPAQUE) 300 MG/ML solution 100 mL (100 mLs Intravenous Contrast Given 01/27/22 1842)  ?lactated ringers bolus 1,000 mL (0 mLs Intravenous Stopped 01/27/22 2224)  ?ketorolac (TORADOL) 30 MG/ML injection 15 mg (15 mg Intravenous Given 01/27/22 2102)  ?morphine (PF) 4 MG/ML injection 4 mg (4 mg Intravenous Given 01/27/22 2103)  ?ondansetron [facility]) injection 4 mg (4 mg Intravenous Given 01/27/22 2102)  ? ? ? ?IMPRESSION / MDM / ASSESSMENT AND PLAN / ED COURSE  ?I reviewed the triage vital signs and the nursing notes. ?             ?               ? ?Ddx:  ?Viral GI illness, appendicitis, diverticulitis, colitis, foodborne illness ? ? ?MDM:  ?Well-appearing 21 year old female here with nausea, vomiting, and diarrhea.  On arrival, patient febrile, tachycardic, dehydrated.  Abdomen with moderate  but primarily right-sided tenderness.  CT abdomen and pelvis subsequently obtained in triage, reviewed by me, and fortunately shows no evidence of appendicitis or other surgical abnormality.  Patient does have an enlarged, fatty liver and moderate transaminitis.  On review of records, she has had fairly persistent transaminitis that this is slightly higher than usual.  I suspect she may have underlying fatty liver disease.  She has normal bilirubin, no right upper quadrant specific tenderness, negative Murphy's, no evidence of gallstones or cholecystitis on imaging, do not suspect biliary disease.  She has a small amount of free fluid in the cul-de-sac, has a history of ovarian cysts and states this is very common.  Otherwise, no leukocytosis or anemia.  Lipase is normal.  Renal function normal.  Lactic acid normal.  She feels markedly improved after IV fluids and fever control.  Urinalysis from urgent care reviewed and is unremarkable. ? ?Suspect possible viral GI illness.  Will discharge with supportive care and good return precautions.  Counseled her on returning it with any persistent or worsening right lower quadrant pain. ? ? ?MEDICATIONS GIVEN IN ED: ?Medications  ?sodium chloride 0.9 % bolus 1,000 mL (1,000 mLs Intravenous Patient Refused/Not Given 01/27/22 2242)  ?sodium chloride 0.9 % bolus 1,000 mL (0 mLs Intravenous Stopped 01/27/22 2101)  ?iohexol (OMNIPAQUE) 300 MG/ML solution 100 mL (100 mLs Intravenous Contrast Given 01/27/22 1842)  ?lactated ringers bolus 1,000 mL (0 mLs Intravenous Stopped 01/27/22 2224)  ?ketorolac (TORADOL) 30 MG/ML injection 15 mg (15 mg Intravenous Given 01/27/22 2102)  ?morphine (PF) 4 MG/ML injection 4 mg (4 mg Intravenous Given 01/27/22 2103)  ?ondansetron [facility]) injection 4 mg (4 mg Intravenous Given 01/27/22 2102)  ? ? ? ?Consults:  ? ? ? ?EMR reviewed  ?Reviewed urgent care notes, including urinalysis results which are unremarkable ? ? ? ? ?FINAL CLINICAL IMPRESSION(S) / ED DIAGNOSES   ? ?Final diagnoses:  ?Nausea vomiting and diarrhea  ?Dehydration  ? ? ? ?Rx / DC Orders  ? ?ED Discharge Orders   ? ?      Ordered  ?  ondansetron (ZOFRAN-ODT) 4 MG disintegrating tablet  Every 8 hours PRN       ? 01/27/22 2229  ?  ibuprofen (ADVIL) 600 MG tablet  Every 8 hours PRN       ? 01/27/22 2229  ?  promethazine (PHENERGAN) 25 MG suppository  Every 8 hours PRN       ?  01/27/22 2229  ? ?  ?  ? ?  ? ? ? ?Note:  This document was prepared using Dragon voice recognition software and may include unintentional dictation errors. ?  ?Shaune PollackIsaacs, Drayven Marchena, MD ?01/27/22 2333 ? ?

## 2022-01-27 NOTE — ED Triage Notes (Signed)
Patient is here for "Abd Pain". "Woke up this morning around 5-6 am with this pain, around 930-10am started throwing up with diarrhea, head hurts, vision is blurry, very dizzy". Last emesis "330 pm". Last diarrhea/loose stool "just now". No Fever (known) "chills though". Abd pain "started left upper abd, now around upper umbilical area moving to lower abd, bilateral".   ?

## 2022-01-27 NOTE — ED Provider Notes (Signed)
?[facility] ? ? ? ?CSN: [id number] ?Arrival date & time: 01/27/22  1545 ? ? ?  ? ?History   ?Chief Complaint ?Chief Complaint  ?Patient presents with  ? Abdominal Pain  ? Emesis  ? ? ?HPI ?Melanie Vasquez is a 21 y.o. female presenting for fever, fatigue, nausea/vomiting and diarrhea as well as periumbilical abdominal pain.  Symptoms started about 12 hours ago she also reports headache, blurry vision, feeling dizzy and fatigued.  Patient says abdominal pain has moved down to the lower abdomen specifically the right lower quadrant and is reportedly 9 out of 10.  Periumbilical pain is reportedly 5-6 out of 10.  Patient has not taken any medication for pain relief.  Her temp is currently 100.7 degrees and she is tachycardic at 117 bpm.  She is denying any chest pain or breathing difficulty, dysuria, urinary frequency or urgency.  No vaginal discharge, itching or odor.  No vaginal bleeding.  Patient denies cough, congestion or URI symptoms.  No other complaints. ? ?HPI ? ?Past Medical History:  ?Diagnosis Date  ? Anxiety   ? Asthma   ? Depression   ? ? ?Patient Active Problem List  ? Diagnosis Date Noted  ? IUD (intrauterine device) in place 09/11/2021  ? Vaginal irritation 09/11/2021  ? Vaginal prolapse 09/11/2021  ? Retained products of conception, postpartum 11/21/2020  ? 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 Family history of bipolar disorder 04/27/2016  ? Family history of pulmonary fibrosis 04/27/2016  ? ? ?Past Surgical History:  ?Procedure Laterality Date  ? DILATION AND CURETTAGE OF UTERUS N/A 11/21/2020  ? Procedure: DILATATION AND CURETTAGE;  Surgeon: Conard NovakJackson, Stephen D, MD;  Location: [facility] ORS;  Service: Gynecology;  Laterality: N/A;  ? IUD REMOVAL  11/21/2020  ? Procedure: incidental INTRAUTERINE DEVICE (IUD) REMOVAL;  Surgeon: Conard NovakJackson, Stephen D, MD;  Location: [facility] ORS;  Service: Gynecology;;  ? TONSILLECTOMY    ? WISDOM TOOTH  EXTRACTION    ? ? ?OB History   ? ? Gravida  ?1  ? Para  ?0  ? Term  ?0  ? Preterm  ?0  ? AB  ?0  ? Living  ?0  ?  ? ? SAB  ?0  ? IAB  ?0  ? Ectopic  ?0  ? Multiple  ?0  ? Live Births  ?0  ?   ?  ?  ? ? ? ?Home Medications   ? ?Prior to Admission medications   ?Medication Sig Start Date End Date Taking? Authorizing Provider  ?acetaminophen (TYLENOL) 650 MG CR tablet Take by mouth. 11/15/20  Yes [provider]  ?conjugated estrogens (PREMARIN) vaginal cream Place vaginally. 03/23/21 03/23/22 Yes [provider]  ?DULoxetine (CYMBALTA) 20 MG capsule Take by mouth. 11/27/21  Yes [provider]  ?ibuprofen (ADVIL) 200 MG tablet Take by mouth. 11/15/20  Yes [provider]  ?traZODone (DESYREL) 50 MG tablet Take by mouth. 11/27/21  Yes [provider]  ?valACYclovir (VALTREX) 500 MG tablet Take 1 tablet po bid x 3 days as needed for an outbreak 08/24/21  Yes [provider]  ?albuterol (VENTOLIN HFA) 108 (90 Base) MCG/ACT inhaler Inhale into the lungs.    [provider]  ?Clindamycin-Benzoyl Per, Refr, gel 1 applicator at bedtime. 11/27/21   [provider]  ?diclofenac Sodium (VOLTAREN) 1 % GEL Apply topically. 01/12/22   [provider]  ?diphenhydrAMINE (BENADRYL) 50 MG capsule  Take by mouth.    [provider]  ?DULoxetine (CYMBALTA) 20 MG capsule Take by mouth. 11/27/21   [provider]  ?escitalopram (LEXAPRO) 10 MG tablet Take by mouth. 01/11/22   [provider]  ?ibuprofen (ADVIL) 800 MG tablet Take 1 tablet (800 mg total) by mouth every 8 (eight) hours as needed for headache. 09/24/21   Eusebio Friendly B, PA-C  ?lidocaine (LIDODERM) 5 % 1 patch daily. 01/11/22   [provider]  ?MELATONIN MAXIMUM STRENGTH 5 MG TABS Take 5-10 mg by mouth at bedtime. 01/11/22   [provider]  ?methocarbamol (ROBAXIN) 500 MG tablet Take 500 mg by mouth 2 (two) times daily. 11/30/21   [provider]   ?metroNIDAZOLE (FLAGYL) 500 MG tablet Take 500 mg by mouth 2 (two) times daily. 09/14/21   [provider]  ?MIRENA, 52 MG, 20 MCG/DAY IUD  01/11/22   [provider]  ?Multiple Vitamin (MULTI-VITAMIN PO) Take 1 tablet by mouth daily.    [provider]  ?ORILISSA 150 MG TABS Take 150 mg by mouth daily. 09/16/21   [provider]  ?promethazine-dextromethorphan (PROMETHAZINE-DM) 6.25-15 MG/5ML syrup Take 5 mLs by mouth 4 (four) times daily as needed. 09/24/21   Eusebio Friendly B, PA-C  ?pyridOXINE (VITAMIN B-6) 25 MG tablet Take by mouth.    [provider]  ?SYMBICORT 160-4.5 MCG/ACT inhaler Inhale into the lungs. 01/11/22   [provider]  ?tiZANidine (ZANAFLEX) 2 MG tablet Take 2 mg by mouth 3 (three) times daily. 10/30/21   [provider]  ?traZODone (DESYREL) 50 MG tablet Take 50 mg by mouth at bedtime. 11/30/21   [provider]  ?valACYclovir (VALTREX) 500 MG tablet Take 500 mg by mouth 2 (two) times daily as needed. 08/24/21   [provider]  ? ? ?Family History ?No family history on file. ? ?Social History ?Social History  ? ?Tobacco Use  ? Smoking status: Never  ? Smokeless tobacco: Never  ?Vaping Use  ? Vaping Use: Former  ? Quit date: 03/11/2020  ?Substance Use Topics  ? Alcohol use: Not Currently  ?  Comment: last drink in 02/22/20  ? Drug use: Never  ? ? ? ?Allergies   ?Patient has no known allergies. ? ? ?Review of Systems ?Review of Systems  ?Constitutional:  Positive for appetite change, fatigue and fever. Negative for chills and diaphoresis.  ?HENT:  Negative for congestion and sore throat.   ?Eyes:  Positive for visual disturbance.  ?Respiratory:  Negative for cough and shortness of breath.   ?Cardiovascular:  Negative for chest pain.  ?Gastrointestinal:  Positive for abdominal pain, nausea and vomiting. Negative for blood in stool and constipation.  ?Musculoskeletal:  Positive for back pain (chronic). Negative for  arthralgias and myalgias.  ?Skin:  Negative for rash.  ?Neurological:  Positive for dizziness, light-headedness and headaches. Negative for syncope.  ? ? ?Physical Exam ?Triage Vital Signs ?ED Triage Vitals  ?Enc Vitals Group  ?   BP 01/27/22 1623 139/75  ?   Pulse Rate 01/27/22 1623 (!) 116  ?   Resp 01/27/22 1623 18  ?   Temp 01/27/22 1623 (!) 100.7 ?F (38.2 ?C)  ?   Temp Source 01/27/22 1623 Oral  ?   SpO2 01/27/22 1623 97 %  ?   Weight 01/27/22 1621 280 lb (127 kg)  ?   Height 01/27/22 1621 5\' 7"  (1.702 m)  ?   Head Circumference --   ?  Peak Flow --   ?   Pain Score 01/27/22 1617 7  ?   Pain Loc --   ?   Pain Edu? --   ?   Excl. in GC? --   ? ?No data found. ? ?Updated Vital Signs ?BP 139/75 (BP Location: Right Arm)   Pulse (!) 117   Temp (!) 100.7 ?F (38.2 ?C) (Oral)   Resp 18   Ht 5\' 7"  (1.702 m)   Wt 280 lb (127 kg)   LMP  (LMP Unknown)   SpO2 97%   BMI 43.85 kg/m?  ?   ? ?Physical Exam ?Vitals and nursing note reviewed.  ?Constitutional:   ?   General: She is not in acute distress. ?   Appearance: Normal appearance. She is well-developed. She is obese. She is ill-appearing. She is not toxic-appearing.  ?HENT:  ?   Head: Normocephalic and atraumatic.  ?   Nose: Nose normal.  ?   Mouth/Throat:  ?   Mouth: Mucous membranes are moist.  ?   Pharynx: Oropharynx is clear.  ?Eyes:  ?   General: No scleral icterus.    ?   Right eye: No discharge.     ?   Left eye: No discharge.  ?   Conjunctiva/sclera: Conjunctivae normal.  ?Cardiovascular:  ?   Rate and Rhythm: Regular rhythm. Tachycardia present.  ?   Heart sounds: Normal heart sounds.  ?Pulmonary:  ?   Effort: Pulmonary effort is normal. No respiratory distress.  ?   Breath sounds: Normal breath sounds.  ?Abdominal:  ?   General: Bowel sounds are normal.  ?   Palpations: Abdomen is soft.  ?   Tenderness: There is abdominal tenderness in the right upper quadrant, right lower quadrant, epigastric area and periumbilical area. There is guarding. There is no  right CVA tenderness or left CVA tenderness.  ?Musculoskeletal:  ?   Cervical back: Neck supple.  ?Skin: ?   General: Skin is dry.  ?Neurological:  ?   General: No focal deficit present.  ?   Mental Status: She is alert. Mental st

## 2022-01-27 NOTE — Discharge Instructions (Signed)
I have serious concerns for acute appendicitis.  You need to go to the emergency department immediately.  I will call and let them know that you are coming.  As we discussed, if you do have appendicitis it is a surgical emergency and time is very important. ? ?You have been advised to follow up immediately in the emergency department for concerning signs.symptoms. If you declined EMS transport, please have a family member take you directly to the ED at this time. Do not delay. Based on concerns about condition, if you do not follow up in th e ED, you may risk poor outcomes including worsening of condition, delayed treatment and potentially life threatening issues. If you have declined to go to the ED at this time, you should call your PCP immediately to set up a follow up appointment. ? ?Go to ED for red flag symptoms, including; fevers you cannot reduce with Tylenol/Motrin, severe headaches, vision changes, numbness/weakness in part of the body, lethargy, confusion, intractable vomiting, severe dehydration, chest pain, breathing difficulty, severe persistent abdominal or pelvic pain, signs of severe infection (increased redness, swelling of an area), feeling faint or passing out, dizziness, etc. You should especially go to the ED for sudden acute worsening of condition if you do not elect to go at this time.  ?

## 2022-01-27 NOTE — ED Notes (Signed)
Patient is being discharged from the Urgent Care and sent to the Emergency Department via personal vehicle . Per Leaves, PA, patient is in need of higher level of care due to possible appendicitis. Patient is aware and verbalizes understanding of plan of care.  ?Vitals:  ? 01/27/22 1623 01/27/22 1625  ?BP: 139/75   ?Pulse: (!) 116 (!) 117  ?Resp: 18   ?Temp: (!) 100.7 ?F (38.2 ?C)   ?SpO2: 97% 97%  ?  ?

## 2022-01-27 NOTE — ED Provider Triage Note (Signed)
Emergency Medicine Provider Triage Evaluation Note ? ?Melanie Vasquez , a 21 y.o. female  was evaluated in triage.  Pt complains of right lower quadrant pain.  Patient's had abdominal pain along with nausea and vomiting and diarrhea.  Pain has lowered to the right lower quadrant.  Was seen at med in urgent care, urinalysis was normal, also they did a flu/COVID swab. ? ?Review of Systems  ?Positive: Right lower quadrant pain, fever, chills, elevated heart rate ?Negative: UTI symptoms ? ?Physical Exam  ?BP 139/71 (BP Location: Right Arm)   Pulse (!) 116   Temp (!) 100.5 ?F (38.1 ?C) (Oral)   Resp 16   Ht 5\' 7"  (1.702 m)   Wt 122.5 kg   LMP  (LMP Unknown)   SpO2 97%   BMI 42.29 kg/m?  ?Gen:   Awake, no distress   ?Resp:  Normal effort  ?MSK:   Moves extremities without difficulty  ?Other:  Abdomen is tender in the right lower quadrant ? ?Medical Decision Making  ?Medically screening exam initiated at 5:36 PM.  Appropriate orders placed.  Melanie Vasquez was informed that the remainder of the evaluation will be completed by another provider, this initial triage assessment does not replace that evaluation, and the importance of remaining in the ED until their evaluation is complete. ? ?Nursing staff to put a IV in the patient, labs to be ordered along with lactic due to the fever and tachycardia, fluids started, concerns for appendicitis a CT of the abdomen/pelvis with IV contrast ordered ?  ?Melanie Bellows, PA-C ?01/27/22 1740 ? ?

## 2022-01-27 NOTE — ED Notes (Signed)
This RN called to assess patient by patient's significant other d/t pt feeling increasingly lightheaded and hot. VS obtained, stable. IVF bolus infusing as ordered. No S/S of distress noted. Pt declined feet being raised and head lowered in recliner at this time.  ?

## 2022-01-27 NOTE — ED Triage Notes (Addendum)
Pt arrive with c/o ABD pain that started this morning. Per pt, pain started around her umbilical area and now has moved to RLQ. Pt endorses n/v/d and chills. Pt denies fever. Pt received tylenol at 16:30. ? ?

## 2022-01-27 NOTE — ED Notes (Signed)
Pt was PO challenged at this time  

## 2022-09-03 DIAGNOSIS — F331 Major depressive disorder, recurrent, moderate: Secondary | ICD-10-CM | POA: Insufficient documentation

## 2022-09-03 DIAGNOSIS — K219 Gastro-esophageal reflux disease without esophagitis: Secondary | ICD-10-CM | POA: Insufficient documentation

## 2022-09-03 DIAGNOSIS — R1084 Generalized abdominal pain: Secondary | ICD-10-CM | POA: Insufficient documentation

## 2022-09-03 DIAGNOSIS — Z719 Counseling, unspecified: Secondary | ICD-10-CM | POA: Insufficient documentation

## 2022-09-10 DIAGNOSIS — G4700 Insomnia, unspecified: Secondary | ICD-10-CM | POA: Insufficient documentation

## 2022-10-15 DIAGNOSIS — G43109 Migraine with aura, not intractable, without status migrainosus: Secondary | ICD-10-CM | POA: Insufficient documentation

## 2023-01-27 IMAGING — CT CT ABD-PELV W/ CM
2 of 4 series · 16 of 46 positions shown, 18 images · IV contrast (agent unspecified)
Comparison: 12/15/2020

CLINICAL DATA: Pain right lower quadrant

EXAM:
CT ABDOMEN AND PELVIS WITH CONTRAST
TECHNIQUE: Multidetector CT imaging of the abdomen and pelvis was performed
using the standard protocol following bolus administration of
intravenous contrast.

[Series 2: axial st · axial · 0.98mm/px · z∈[-1051,-546]mm · 13 of 111 slices shown, 15 images]
[im 5/111  soft-tissue]
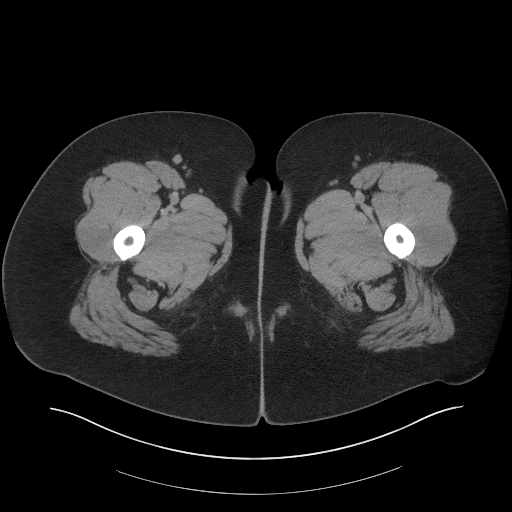
[im 5/111  bone]
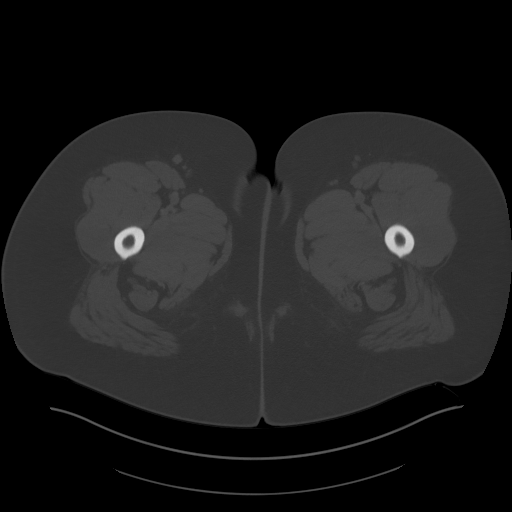
[im 14/111  soft-tissue]
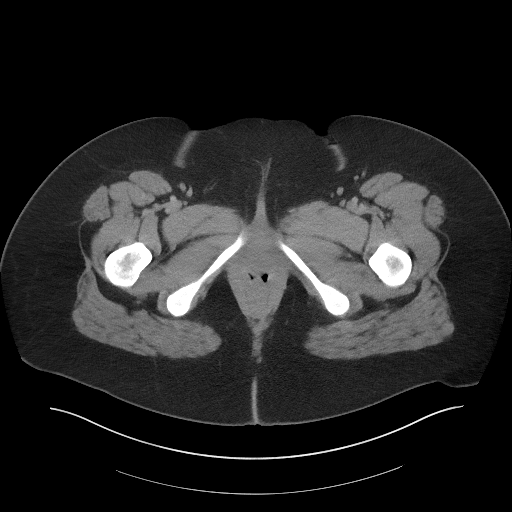
[im 23/111  soft-tissue]
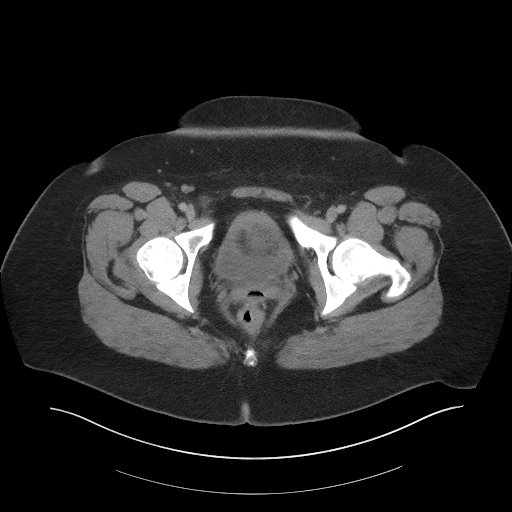
[im 33/111  soft-tissue]
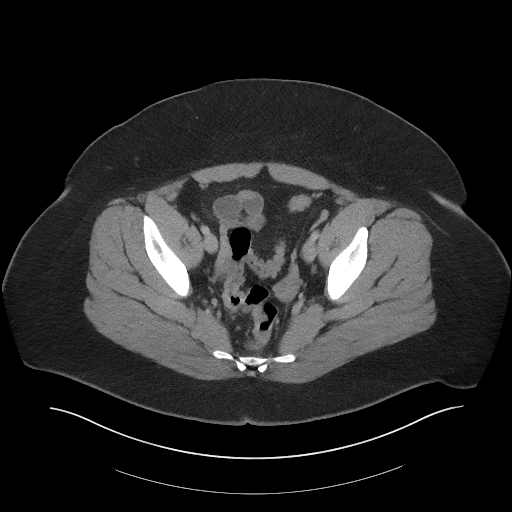
[im 37/111  soft-tissue]
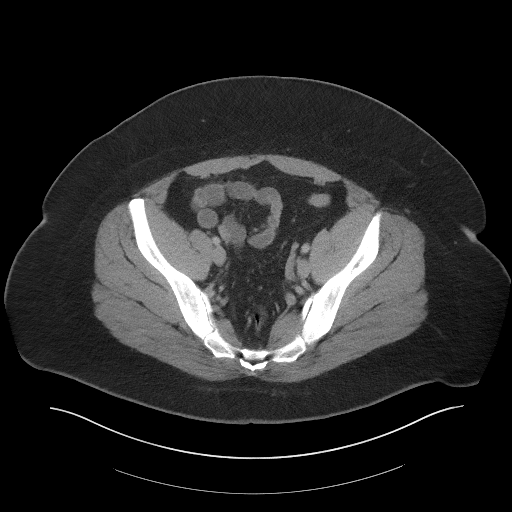
[im 46/111  soft-tissue]
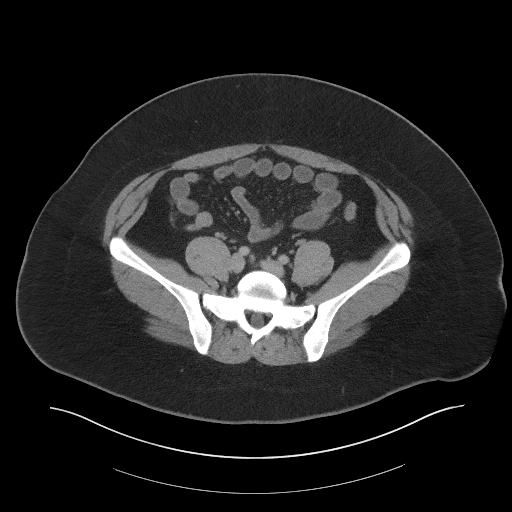
[im 56/111  soft-tissue]
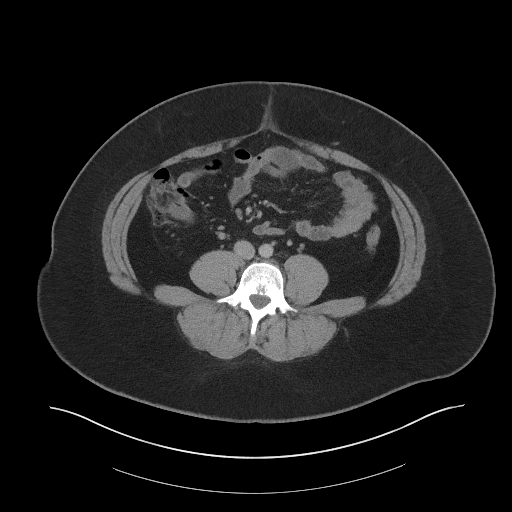
[im 65/111  soft-tissue]
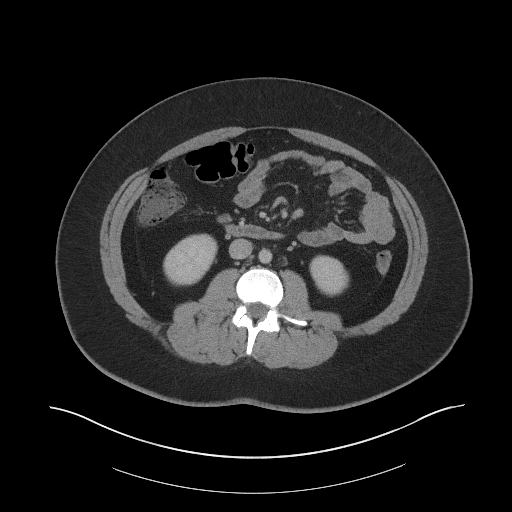
[im 74/111  soft-tissue]
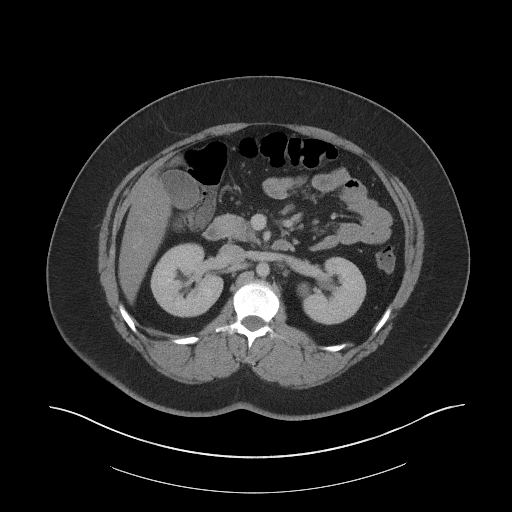
[im 74/111  bone]
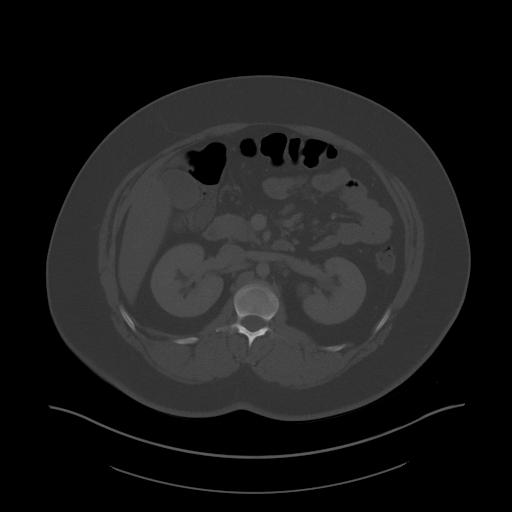
[im 78/111  soft-tissue]
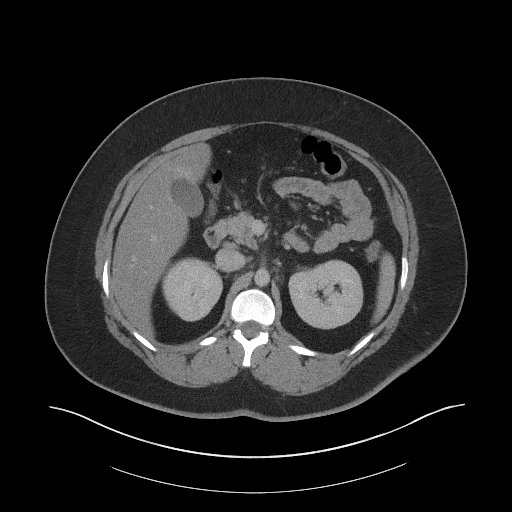
[im 88/111  soft-tissue]
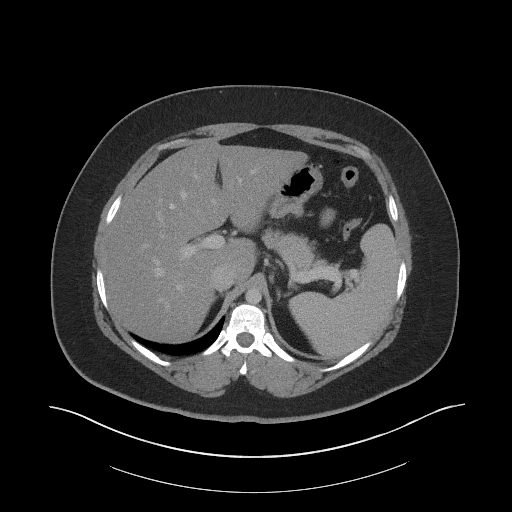
[im 97/111  soft-tissue]
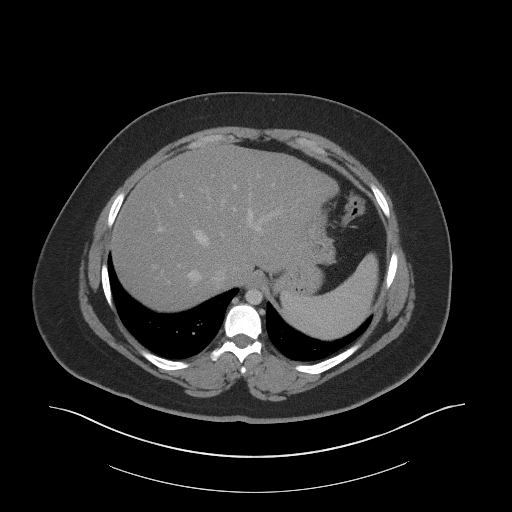
[im 106/111  soft-tissue]
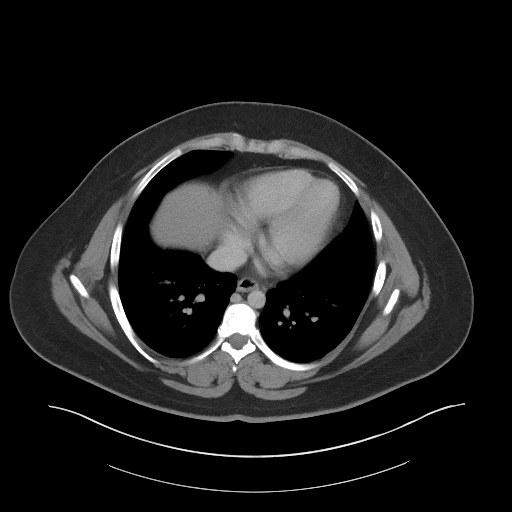

[Series 5: coronal st · coronal · 0.98mm/px · 3 of 112 slices shown]
[im 38/112  soft-tissue]
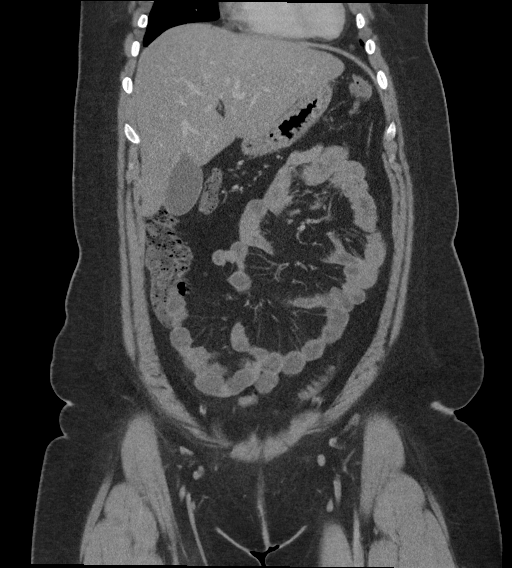
[im 50/112  soft-tissue]
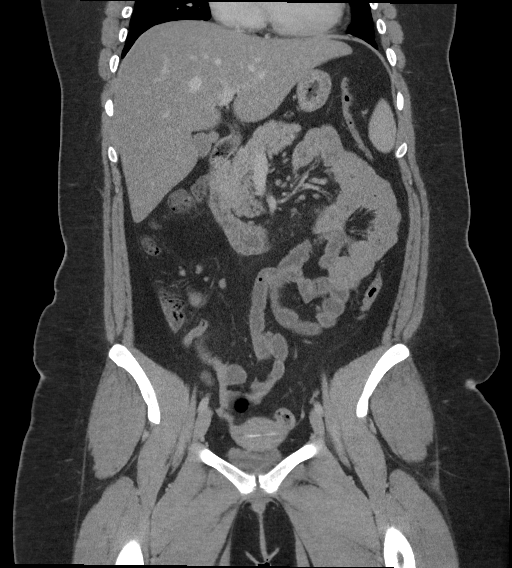
[im 62/112  soft-tissue]
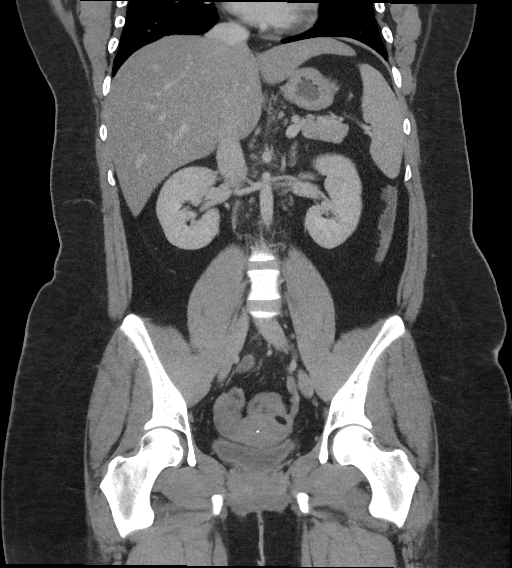

[16 of 46 positions shown; findings below may reference images not displayed]

RADIATION DOSE REDUCTION: This exam was performed according to the
departmental dose-optimization program which includes automated
exposure control, adjustment of the mA and/or kV according to
patient size and/or use of iterative reconstruction technique.

CONTRAST:  100mL OMNIPAQUE IOHEXOL 300 MG/ML  SOLN
FINDINGS: Lower chest: Visualized lower lung fields are clear.

Hepatobiliary: Liver measures 20.2 cm in length. There is fatty
infiltration. There is no dilation of bile ducts. Gallbladder is
unremarkable.

Pancreas: No focal abnormality is seen.

Spleen: Spleen measures 14.6 cm in maximum diameter.

Adrenals/Urinary Tract: Adrenals are unremarkable. There is no
hydronephrosis. There are no renal or ureteral stones. Urinary
bladder is almost completely empty and not optimally evaluated.

Stomach/Bowel: Stomach is unremarkable. Small bowel loops are not
dilated. Appendix is not dilated. There is no focal pericecal
inflammation. There is no significant wall thickening in colon.
There is no pericolic stranding. Few diverticula are seen in the
colon without signs of focal acute diverticulitis.

Vascular/Lymphatic: Unremarkable.

Reproductive: IUD is seen in the uterus. There are no dominant
adnexal masses. There is small amount of free fluid in cul-de-sac,
possibly due to physiological rupture of ovarian follicle.

Other: There is no pneumoperitoneum. Umbilical hernia containing fat
is seen.

Musculoskeletal: Unremarkable.
IMPRESSION: There is no evidence of intestinal obstruction or pneumoperitoneum.
Appendix is not dilated. There is no hydronephrosis.

Enlarged fatty liver. Enlarged spleen. Small amount of free fluid in
the cul-de-sac in the pelvis may suggest recent rupture of ovarian
follicle. Few diverticula are seen in colon without signs of focal
diverticulitis.

Other findings as described in the body of the report.

## 2023-02-18 ENCOUNTER — Ambulatory Visit
Admission: EM | Admit: 2023-02-18 | Discharge: 2023-02-18 | Disposition: A | Discharge status: Home / Self Care | Payer: Medicaid Other

## 2023-02-18 DIAGNOSIS — J0110 Acute frontal sinusitis, unspecified: Secondary | ICD-10-CM

## 2023-02-18 MED ORDER — IPRATROPIUM BROMIDE 0.03 % NA SOLN: 2.0000 | Freq: Two times a day (BID) | NASAL | 12 refills | Status: DC

## 2023-02-18 MED ORDER — LORATADINE 10 MG PO TABS: 10.0000 mg | ORAL_TABLET | Freq: Every day | ORAL | 0 refills | Status: DC

## 2023-02-18 MED ORDER — AMOXICILLIN-POT CLAVULANATE 875-125 MG PO TABS: 1.0000 | ORAL_TABLET | Freq: Two times a day (BID) | ORAL | 0 refills | Status: AC
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
## 2023-02-18 NOTE — ED Provider Notes (Signed)
[facility]    CSN: [id number] Arrival date & time: 02/18/23  0855      History   Chief Complaint Chief Complaint  Patient presents with   Nasal Congestion    HPI Melanie Vasquez is a 22 y.o. female.   Patient presents for evaluation of clear watery drainage from the nose present for 3 to 4 months, worsening over the last week, has begun to experience congestion today.  Has had associated frontal headaches occurring intermittently as well as intermittent dizziness.  Dizziness is described as the room spinning, exacerbated when changing from a sitting to standing position and when leaning over.  Drainage worsens when leaning over, experiencing primarily in the morning, described as turning a water faucet on.  Has begun to experience throughout the day over the past week.  Has had intermittent nausea with vomiting with last occurrence 3 days ago, has been able to tolerate food and liquids but endorses a decreased appetite.  Endorses mild neck discomfort as if she has slept wrong in the bed.  Endorses a head injury that occurred 3 months ago while getting out of the car, hit head on the exterior of the car, denies loss of consciousness, did not seek immediate evaluation.  Has attempted use of ibuprofen and pseudoephedrine which has been minimally helpful.  Denies fevers.      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   Family history of bipolar disorder 04/27/2016   Family history of pulmonary fibrosis 04/27/2016    Past Surgical History:  Procedure Laterality Date   DILATION AND CURETTAGE OF UTERUS N/A 11/21/2020    Procedure: DILATATION AND CURETTAGE;  Surgeon: Conard Novak, MD;  Location: [facility];  Service: Gynecology;  Laterality: N/A;   IUD REMOVAL  11/21/2020   Procedure: incidental INTRAUTERINE DEVICE (IUD) REMOVAL;  Surgeon: Conard Novak, MD;  Location: [facility];  Service: Gynecology;;   TONSILLECTOMY     WISDOM TOOTH EXTRACTION      OB History     Gravida  1   Para  0   Term  0   Preterm  0   AB  0   Living  0      SAB  0   IAB  0   Ectopic  0   Multiple  0   Live Births  0            Home Medications    Prior to Admission medications   Medication Sig Start Date End Date Taking? Authorizing Provider  lithium carbonate (LITHOBID) 300 MG ER tablet Take by mouth. 12/23/22  Yes [provider]  MIRENA, 52 MG, 20 MCG/DAY IUD  01/11/22  Yes [provider]  omeprazole (PRILOSEC) 20 MG capsule  09/06/22  Yes [provider]  QUEtiapine (SEROQUEL) 50 MG tablet Take by mouth. 12/02/22  Yes [provider]  acetaminophen (TYLENOL) 650 MG CR tablet Take by mouth. 11/15/20   [provider]  albuterol (VENTOLIN HFA) 108 (90 Base) MCG/ACT inhaler Inhale into the lungs.    [provider]  Clindamycin-Benzoyl Per, Refr,  gel 1 applicator at bedtime. 11/27/21   [provider]  diclofenac Sodium (VOLTAREN) 1 % GEL Apply topically. 01/12/22   [provider]  diphenhydrAMINE (BENADRYL) 50 MG capsule Take by mouth.    [provider]  DULoxetine (CYMBALTA) 20 MG capsule Take by mouth. 11/27/21   [provider]  DULoxetine (CYMBALTA) 20 MG capsule Take by mouth. 11/27/21   [provider]  escitalopram (LEXAPRO) 10 MG tablet Take by mouth. 01/11/22   [provider]  ibuprofen (ADVIL) 600 MG tablet Take 1 tablet (600 mg total) by mouth every 8 (eight) hours as needed for fever or moderate pain. 01/27/22   Shaune Pollack, MD  lidocaine (LIDODERM) 5 % 1 patch daily. 01/11/22    [provider]  MELATONIN MAXIMUM STRENGTH 5 MG TABS Take 5-10 mg by mouth at bedtime. 01/11/22   [provider]  methocarbamol (ROBAXIN) 500 MG tablet Take 500 mg by mouth 2 (two) times daily. 11/30/21   [provider]  metroNIDAZOLE (FLAGYL) 500 MG tablet Take 500 mg by mouth 2 (two) times daily. 09/14/21   [provider]  Multiple Vitamin (MULTI-VITAMIN PO) Take 1 tablet by mouth daily.    [provider]  ondansetron (ZOFRAN-ODT) 4 MG disintegrating tablet Take 1 tablet (4 mg total) by mouth every 8 (eight) hours as needed for nausea or vomiting. 01/27/22   Shaune Pollack, MD  ORILISSA 150 MG TABS Take 150 mg by mouth daily. 09/16/21   [provider]  promethazine (PHENERGAN) 25 MG suppository Place 1 suppository (25 mg total) rectally every 8 (eight) hours as needed for refractory nausea / vomiting. 01/27/22   Shaune Pollack, MD  promethazine-dextromethorphan (PROMETHAZINE-DM) 6.25-15 MG/5ML syrup Take 5 mLs by mouth 4 (four) times daily as needed. 09/24/21   Eusebio Friendly B, PA-C  pyridOXINE (VITAMIN B-6) 25 MG tablet Take by mouth.    [provider]  SYMBICORT 160-4.5 MCG/ACT inhaler Inhale into the lungs. 01/11/22   [provider]  tiZANidine (ZANAFLEX) 2 MG tablet Take 2 mg by mouth 3 (three) times daily. 10/30/21   [provider]  traZODone (DESYREL) 50 MG tablet Take 50 mg by mouth at bedtime. 11/30/21   [provider]  traZODone (DESYREL) 50 MG tablet Take by mouth. 11/27/21   [provider]  valACYclovir (VALTREX) 500 MG tablet Take 500 mg by mouth 2 (two) times daily as needed. 08/24/21   [provider]  valACYclovir (VALTREX) 500 MG tablet Take 1 tablet po bid x 3 days as needed for an outbreak 08/24/21   [provider]    Family History History reviewed. No pertinent family history.  Social History Social History   Tobacco Use   Smoking status: Never    Smokeless tobacco: Never  Vaping Use   Vaping Use: Former   Quit date: 03/11/2020  Substance Use Topics   Alcohol use: Not Currently    Comment: last drink in 02/22/20   Drug use: Never     Allergies   Patient has no known allergies.   Review of Systems Review of Systems   Physical Exam Triage Vital Signs ED Triage Vitals  Enc Vitals Group     BP 02/18/23 0914 111/74     Pulse Rate 02/18/23 0914 90     Resp --      Temp 02/18/23 0914 98.8 F (37.1 C)     Temp Source 02/18/23 0914 Oral     SpO2 02/18/23 0914 96 %  Weight 02/18/23 0909 260 lb (117.9 kg)     Height 02/18/23 0909 5\' 6"  (1.676 m)     Head Circumference --      Peak Flow --      Pain Score 02/18/23 0909 8     Pain Loc --      Pain Edu? --      Excl. in GC? --    No data found.  Updated Vital Signs BP 111/74 (BP Location: Left Arm)   Pulse 90   Temp 98.8 F (37.1 C) (Oral)   Ht 5\' 6"  (1.676 m)   Wt 260 lb (117.9 kg)   SpO2 96%   BMI 41.97 kg/m   Visual Acuity Right Eye Distance:   Left Eye Distance:   Bilateral Distance:    Right Eye Near:   Left Eye Near:    Bilateral Near:     Physical Exam Constitutional:      Appearance: Normal appearance.  HENT:     Head: Normocephalic.     Right Ear: Tympanic membrane, ear canal and external ear normal.     Left Ear: Tympanic membrane, ear canal and external ear normal.     Nose: Congestion and rhinorrhea present.     Right Sinus: Frontal sinus tenderness present. No maxillary sinus tenderness.     Left Sinus: Frontal sinus tenderness present. No maxillary sinus tenderness.     Mouth/Throat:     Pharynx: No posterior oropharyngeal erythema.  Eyes:     Extraocular Movements: Extraocular movements intact.  Cardiovascular:     Rate and Rhythm: Normal rate and regular rhythm.     Pulses: Normal pulses.     Heart sounds: Normal heart sounds.  Pulmonary:     Effort: Pulmonary effort is normal.     Breath sounds: Normal breath sounds.   Skin:    General: Skin is warm and dry.  Neurological:     General: No focal deficit present.     Mental Status: She is alert and oriented to person, place, and time. Mental status is at baseline.      UC Treatments / Results  Labs (all labs ordered are listed, but only abnormal results are displayed) Labs Reviewed - No data to display  EKG   Radiology No results found.  Procedures Procedures (including critical care time)  Medications Ordered in UC Medications - No data to display  Initial Impression / Assessment and Plan / UC Course  I have reviewed the triage vital signs and the nursing notes.  Pertinent labs & imaging results that were available during my care of the patient were reviewed by me and considered in my medical decision making (see chart for details).  Acute nonrecurrent frontal sinusitis  Etiology is most likely diagnosis as well as seasonal allergies, discussed this with patient, no neurological abnormalities on exam and due to timeline of illness low suspicion for relation to head injury however advised to monitor as she has been experiencing headaches and dizziness, prescribed Augmentin, cetirizine and Atrovent nasal spray, may use additional over-the-counter medications as needed for supportive care, may follow-up with his urgent care as needed and given strict precautions for headaches and dizziness worsening severity to go to the nearest emergency department for further evaluation and management Final Clinical Impressions(s) / UC Diagnoses   Final diagnoses:  None   Discharge Instructions   None    ED Prescriptions   None    PDMP not reviewed this encounter.   Salli Quarry  R, NP 02/18/23 [phone]

## 2023-02-18 NOTE — Discharge Instructions (Signed)
Today you are being treated for a sinus infection and allergies, due to the timeline of your symptoms at this time I do not believe anything more serious is going on however if your headache dizziness or lightheadedness worsen in severity please go to the nearest emergency department for further evaluation and management   Take Augmentin every morning and every evening for 10 days    Take cetirizine every day before bed to help minimize congestion  Use nasal spray every morning and every evening to help further clear out the sinuses  You can take Tylenol and/or Ibuprofen as needed for fever reduction and pain relief.   For cough: honey 1/2 to 1 teaspoon (you can dilute the honey in water or another fluid).  You can also use guaifenesin and dextromethorphan for cough. You can use a humidifier for chest congestion and cough.  If you don't have a humidifier, you can sit in the bathroom with the hot shower running.      For sore throat: try warm salt water gargles, cepacol lozenges, throat spray, warm tea or water with lemon/honey, popsicles or ice, or OTC cold relief medicine for throat discomfort.   For congestion: take a daily anti-histamine like Zyrtec, Claritin, and a oral decongestant, such as pseudoephedrine.  You can also use Flonase 1-2 sprays in each nostril daily.   It is important to stay hydrated: drink plenty of fluids (water, gatorade/powerade/pedialyte, juices, or teas) to keep your throat moisturized and help further relieve irritation/discomfort.

## 2023-02-19 ENCOUNTER — Other Ambulatory Visit: Payer: Self-pay

## 2023-02-19 ENCOUNTER — Emergency Department
Admission: EM | Admit: 2023-02-19 | Discharge: 2023-02-19 | Disposition: A | Discharge status: Home / Self Care | Payer: Medicaid Other | Attending: Emergency Medicine | Admitting: Emergency Medicine

## 2023-02-19 DIAGNOSIS — Z1152 Encounter for screening for COVID-19: Secondary | ICD-10-CM | POA: Diagnosis not present

## 2023-02-19 DIAGNOSIS — J069 Acute upper respiratory infection, unspecified: Secondary | ICD-10-CM | POA: Diagnosis not present

## 2023-02-19 DIAGNOSIS — R0981 Nasal congestion: Secondary | ICD-10-CM | POA: Diagnosis present

## 2023-02-19 LAB — SARS CORONAVIRUS 2 BY RT PCR: SARS Coronavirus 2 by RT PCR: NEGATIVE

## 2023-02-19 MED ORDER — KETOROLAC TROMETHAMINE 30 MG/ML IJ SOLN
30.0000 mg | Freq: Once | INTRAMUSCULAR | Status: AC
  Administered 2023-02-19: 30 mg via INTRAMUSCULAR
  Filled 2023-02-19: qty 1

## 2023-02-19 NOTE — ED Triage Notes (Signed)
Pt to ED for clear salty nasal drainage since 2-3 weeks and congestion since Tuesday and sinus infection (diagnosed with NP) diagnosed on Wednesday.  Also has been having HA since 2 days and thinks this is from sinus infection. HA is worse with sitting up.  Pt thinks she had a head injury (hit face on car door and got nosebleed) about 3 weeks ago and says UC told her that her nasal drainage might be br/t the head injury. Pt is alert, oriented with clear speech. Just sounds congested.

## 2023-02-19 NOTE — ED Provider Notes (Signed)
   [facility] Provider Note    Event Date/Time   First MD Initiated Contact with Patient 02/19/23 1228     (approximate)   History   nasal drainage, Nasal Congestion, and Headache   HPI  Melanie Vasquez is a 22 y.o. female who recently developed sinus congestion and drainage and was started on Augmentin at urgent care presents because she reports she is having headaches and fatigue now as well also some bodyaches.     Physical Exam   Triage Vital Signs: ED Triage Vitals  Enc Vitals Group     BP 02/19/23 1223 (!) 144/92     Pulse Rate 02/19/23 1223 95     Resp 02/19/23 1223 16     Temp 02/19/23 1223 98.5 F (36.9 C)     Temp Source 02/19/23 1223 Oral     SpO2 02/19/23 1223 99 %     Weight 02/19/23 1224 118 kg (260 lb 2.3 oz)     Height 02/19/23 1224 1.676 m (5\' 6" )     Head Circumference --      Peak Flow --      Pain Score 02/19/23 1223 8     Pain Loc --      Pain Edu? --      Excl. in GC? --     Most recent vital signs: Vitals:   02/19/23 1223  BP: (!) 144/92  Pulse: 95  Resp: 16  Temp: 98.5 F (36.9 C)  SpO2: 99%     General: Awake, no distress.  Well-appearing in no acute distress, no neurodeficits CV:  Good peripheral perfusion.  Resp:  Normal effort.  Abd:  No distention.  Other:     ED Results / Procedures / Treatments   Labs (all labs ordered are listed, but only abnormal results are displayed) Labs Reviewed  SARS CORONAVIRUS 2 BY RT PCR     EKG     RADIOLOGY     PROCEDURES:  Critical Care performed:   Procedures   MEDICATIONS ORDERED IN ED: Medications  ketorolac (TORADOL) 30 MG/ML injection 30 mg (30 mg Intramuscular Given 02/19/23 1253)     IMPRESSION / MDM / ASSESSMENT AND PLAN / ED COURSE  I reviewed the triage vital signs and the nursing notes. Patient's presentation is most consistent with acute complicated illness / injury requiring diagnostic workup.  Patient well-appearing in no acute  distress, afebrile here, symptoms suspicious for viral illness, possibly COVID, she is on Augmentin for sinus infection.  Will obtain COVID swab, give Toradol for headache, she is not pregnant.  Appropriate for outpatient follow-up, supportive care.        FINAL CLINICAL IMPRESSION(S) / ED DIAGNOSES   Final diagnoses:  Viral upper respiratory tract infection     Rx / DC Orders   ED Discharge Orders     None        Note:  This document was prepared using Dragon voice recognition software and may include unintentional dictation errors.   Jene Every, MD 02/19/23 1257

## 2023-05-02 DIAGNOSIS — R200 Anesthesia of skin: Secondary | ICD-10-CM | POA: Insufficient documentation

## 2023-05-02 DIAGNOSIS — K921 Melena: Secondary | ICD-10-CM | POA: Insufficient documentation

## 2023-06-06 ENCOUNTER — Encounter: Payer: Self-pay | Admitting: *Deleted

## 2023-06-06 ENCOUNTER — Ambulatory Visit
Admission: EM | Admit: 2023-06-06 | Discharge: 2023-06-06 | Disposition: A | Discharge status: Home / Self Care | Payer: MEDICAID | Attending: Physician Assistant | Admitting: Physician Assistant

## 2023-06-06 DIAGNOSIS — N760 Acute vaginitis: Secondary | ICD-10-CM | POA: Diagnosis present

## 2023-06-06 LAB — WET PREP, GENITAL
Sperm: NONE SEEN
Trich, Wet Prep: NONE SEEN
WBC, Wet Prep HPF POC: >10 — AB (ref <10)

## 2023-06-06 MED ORDER — METRONIDAZOLE 500 MG PO TABS: 500.0000 mg | ORAL_TABLET | Freq: Two times a day (BID) | ORAL | 0 refills | Status: AC

## 2023-06-06 MED ORDER — FLUCONAZOLE 150 MG PO TABS: ORAL_TABLET | ORAL | 0 refills | Status: DC

## 2023-06-06 NOTE — ED Provider Notes (Signed)
[facility]    CSN: [id number] Arrival date & time: 06/06/23  1829      History   Chief Complaint Chief Complaint  Patient presents with   Vaginitis    HPI Melanie Vasquez is a 22 y.o. female presenting for thick white vaginal discharge with itching x 2 weeks.  She says the discharge looks like cottage cheese and has gotten worse.  Tried using Monistat but she says it was extremely uncomfortable.  She is no concern for STIs.  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   Family history of bipolar disorder 04/27/2016   Family history of pulmonary fibrosis 04/27/2016    Past Surgical History:  Procedure Laterality Date   DILATION AND CURETTAGE OF UTERUS N/A 11/21/2020   Procedure: DILATATION AND CURETTAGE;  Surgeon: Conard Novak, MD;  Location: [facility];  Service: Gynecology;  Laterality: N/A;   IUD REMOVAL  11/21/2020   Procedure: incidental INTRAUTERINE DEVICE (IUD) REMOVAL;  Surgeon: Conard Novak, MD;  Location: [facility];  Service: Gynecology;;   TONSILLECTOMY     WISDOM TOOTH EXTRACTION      OB History     Gravida  1   Para  0   Term  0   Preterm  0   AB  0   Living  0      SAB  0   IAB  0   Ectopic  0   Multiple  0   Live Births  0            Home Medications    Prior to Admission medications   Medication Sig Start Date End Date Taking? Authorizing Provider  fluconazole (DIFLUCAN) 150 MG tablet Take 1 tab p.o. every 72 hours as needed yeast infection 06/06/23  Yes Eusebio Friendly B, PA-C  metroNIDAZOLE (FLAGYL) 500 MG tablet Take 1 tablet (500 mg total) by  mouth 2 (two) times daily for 7 days. 06/06/23 06/13/23 Yes Shirlee Latch, PA-C  acetaminophen (TYLENOL) 650 MG CR tablet Take by mouth. 11/15/20   [provider]  albuterol (VENTOLIN HFA) 108 (90 Base) MCG/ACT inhaler Inhale into the lungs.    [provider]  Clindamycin-Benzoyl Per, Refr, gel 1 applicator at bedtime. 11/27/21   [provider]  diclofenac Sodium (VOLTAREN) 1 % GEL Apply topically. 01/12/22   [provider]  diphenhydrAMINE (BENADRYL) 50 MG capsule Take by mouth.    [provider]  DULoxetine (CYMBALTA) 20 MG capsule Take by mouth. 11/27/21   [provider]  DULoxetine (CYMBALTA) 20 MG capsule Take by mouth. 11/27/21   [provider]  escitalopram (LEXAPRO) 10 MG tablet Take by mouth. 01/11/22   [provider]  ibuprofen (ADVIL) 600 MG tablet Take 1 tablet (600 mg total) by mouth every 8 (eight) hours as needed for fever or moderate pain. 01/27/22   Shaune Pollack, MD  ipratropium (ATROVENT) 0.03 % nasal spray Place 2 sprays into both nostrils every 12 (twelve) hours. 02/18/23   White, Elita Boone, NP  lidocaine (LIDODERM) 5 % 1 patch daily. 01/11/22  [provider]  lithium carbonate (LITHOBID) 300 MG ER tablet Take by mouth. 12/23/22   [provider]  loratadine (CLARITIN) 10 MG tablet Take 1 tablet (10 mg total) by mouth daily. 02/18/23   White, Elita Boone, NP  MELATONIN MAXIMUM STRENGTH 5 MG TABS Take 5-10 mg by mouth at bedtime. 01/11/22   [provider]  methocarbamol (ROBAXIN) 500 MG tablet Take 500 mg by mouth 2 (two) times daily. 11/30/21   [provider]  MIRENA, 52 MG, 20 MCG/DAY IUD  01/11/22   [provider]  Multiple Vitamin (MULTI-VITAMIN PO) Take 1 tablet by mouth daily.    [provider]  omeprazole (PRILOSEC) 20 MG capsule  09/06/22   [provider]  ondansetron (ZOFRAN-ODT) 4 MG disintegrating tablet Take 1 tablet (4 mg total) by  mouth every 8 (eight) hours as needed for nausea or vomiting. 01/27/22   Shaune Pollack, MD  ORILISSA 150 MG TABS Take 150 mg by mouth daily. 09/16/21   [provider]  promethazine (PHENERGAN) 25 MG suppository Place 1 suppository (25 mg total) rectally every 8 (eight) hours as needed for refractory nausea / vomiting. 01/27/22   Shaune Pollack, MD  promethazine-dextromethorphan (PROMETHAZINE-DM) 6.25-15 MG/5ML syrup Take 5 mLs by mouth 4 (four) times daily as needed. 09/24/21   Eusebio Friendly B, PA-C  pyridOXINE (VITAMIN B-6) 25 MG tablet Take by mouth.    [provider]  QUEtiapine (SEROQUEL) 50 MG tablet Take by mouth. 12/02/22   [provider]  SYMBICORT 160-4.5 MCG/ACT inhaler Inhale into the lungs. 01/11/22   [provider]  tiZANidine (ZANAFLEX) 2 MG tablet Take 2 mg by mouth 3 (three) times daily. 10/30/21   [provider]  traZODone (DESYREL) 50 MG tablet Take 50 mg by mouth at bedtime. 11/30/21   [provider]  traZODone (DESYREL) 50 MG tablet Take by mouth. 11/27/21   [provider]  valACYclovir (VALTREX) 500 MG tablet Take 500 mg by mouth 2 (two) times daily as needed. 08/24/21   [provider]  valACYclovir (VALTREX) 500 MG tablet Take 1 tablet po bid x 3 days as needed for an outbreak 08/24/21   [provider]    Family History History reviewed. No pertinent family history.  Social History Social History   Tobacco Use   Smoking status: Never   Smokeless tobacco: Never  Vaping Use   Vaping status: Former   Quit date: 03/11/2020  Substance Use Topics   Alcohol use: Not Currently    Comment: last drink in 02/22/20   Drug use: Never     Allergies   Patient has no known allergies.   Review of Systems Review of Systems  Constitutional:  Negative for fatigue and fever.  Gastrointestinal:  Negative for abdominal pain.  Genitourinary:  Positive for vaginal discharge. Negative for dysuria, flank  pain, frequency, hematuria, urgency, vaginal bleeding and vaginal pain.  Musculoskeletal:  Negative for back pain.  Skin:  Negative for rash.     Physical Exam Triage Vital Signs ED Triage Vitals  Encounter Vitals Group     BP 06/06/23 1921 123/82     Systolic BP Percentile --      Diastolic BP Percentile --      Pulse Rate 06/06/23 1921 78     Resp --      Temp 06/06/23 1921 98.7 F (37.1 C)     Temp Source 06/06/23 1921 Oral     SpO2 06/06/23 1921 97 %  Weight 06/06/23 1916 290 lb (131.5 kg)     Height 06/06/23 1916 5\' 6"  (1.676 m)     Head Circumference --      Peak Flow --      Pain Score 06/06/23 1916 0     Pain Loc --      Pain Education --      Exclude from Growth Chart --    No data found.  Updated Vital Signs BP 123/82 (BP Location: Left Arm)   Pulse 78   Temp 98.7 F (37.1 C) (Oral)   Ht 5\' 6"  (1.676 m)   Wt 290 lb (131.5 kg)   SpO2 97%   BMI 46.81 kg/m   Physical Exam Vitals and nursing note reviewed.  Constitutional:      General: She is not in acute distress.    Appearance: Normal appearance. She is not ill-appearing or toxic-appearing.  HENT:     Head: Normocephalic and atraumatic.  Eyes:     General: No scleral icterus.       Right eye: No discharge.        Left eye: No discharge.     Conjunctiva/sclera: Conjunctivae normal.  Cardiovascular:     Rate and Rhythm: Normal rate and regular rhythm.  Pulmonary:     Effort: Pulmonary effort is normal. No respiratory distress.  Abdominal:     Palpations: Abdomen is soft.     Tenderness: There is no abdominal tenderness.  Musculoskeletal:     Cervical back: Neck supple.  Skin:    General: Skin is dry.  Neurological:     General: No focal deficit present.     Mental Status: She is alert. Mental status is at baseline.     Motor: No weakness.     Gait: Gait normal.  Psychiatric:        Mood and Affect: Mood normal.        Behavior: Behavior normal.        Thought Content: Thought content  normal.      UC Treatments / Results  Labs (all labs ordered are listed, but only abnormal results are displayed) Labs Reviewed  WET PREP, GENITAL - Abnormal; Notable for the following components:      Result Value   Yeast Wet Prep HPF POC PRESENT (*)    Clue Cells Wet Prep HPF POC PRESENT (*)    WBC, Wet Prep HPF POC >10 (*)    All other components within normal limits    EKG   Radiology No results found.  Procedures Procedures (including critical care time)  Medications Ordered in UC Medications - No data to display  Initial Impression / Assessment and Plan / UC Course  I have reviewed the triage vital signs and the nursing notes.  Pertinent labs & imaging results that were available during my care of the patient were reviewed by me and considered in my medical decision making (see chart for details).   22 year old female presents for thick white vaginal discharge without odor x 2 weeks.  Symptoms have progressively gotten worse despite trying Monistat over-the-counter.  Patient elects to forego pelvic exam and perform vaginal self swab.  Wet prep is positive for clue cells and yeast.  Will treat with metronidazole and Diflucan.  Reviewed return precautions.  She declines an AVS.   Final Clinical Impressions(s) / UC Diagnoses   Final diagnoses:  Acute vaginitis     Discharge Instructions      The most common types of  vaginal infections are yeast infections and bacterial vaginosis. Neither of which are really considered to be sexually transmitted. Often a pH swab or wet prep is performed and if abnormal may reveal either type of infection. Begin metronidazole if prescribed for possible BV infection. If there is concern for yeast infection, fluconazole is often prescribed . Take this as directed. You may also apply topical miconazole (can be purchased OTC) externally for relief of itching. Increase rest and fluid intake. If labs sent out, we will call within 2-5 days  with results and amend treatment if necessary. Always try to use pH balanced washes/wipes, urinate after intercourse, stay hydrated, and take probiotics if you are prone to vaginal infections. Return or see PCP or gynecologist for new/worsening infections.       ED Prescriptions     Medication Sig Dispense Auth. Provider   metroNIDAZOLE (FLAGYL) 500 MG tablet Take 1 tablet (500 mg total) by mouth 2 (two) times daily for 7 days. 14 tablet Eusebio Friendly B, PA-C   fluconazole (DIFLUCAN) 150 MG tablet Take 1 tab p.o. every 72 hours as needed yeast infection 3 tablet Shirlee Latch, PA-C      PDMP not reviewed this encounter.   Shirlee Latch, PA-C 06/06/23 1951

## 2023-06-06 NOTE — Discharge Instructions (Signed)

## 2023-06-06 NOTE — ED Triage Notes (Signed)
Patient states yeast infection like symptoms x 2 weeks burning itching and cottage cheese discharge.  No concern for STI

## 2023-06-08 ENCOUNTER — Ambulatory Visit: Payer: MEDICAID | Admitting: Gastroenterology

## 2023-06-08 ENCOUNTER — Encounter: Payer: Self-pay | Admitting: Gastroenterology

## 2023-06-08 VITALS — BP 133/80 | HR 83 | Temp 98.4°F | Ht 66.0 in | Wt 289.0 lb

## 2023-06-08 DIAGNOSIS — R634 Abnormal weight loss: Secondary | ICD-10-CM

## 2023-06-08 DIAGNOSIS — K625 Hemorrhage of anus and rectum: Secondary | ICD-10-CM

## 2023-06-08 DIAGNOSIS — R194 Change in bowel habit: Secondary | ICD-10-CM | POA: Diagnosis not present

## 2023-06-08 MED ORDER — NA SULFATE-K SULFATE-MG SULF 17.5-3.13-1.6 GM/177ML PO SOLN: 1.0000 | Freq: Once | ORAL | 0 refills | Status: AC

## 2023-06-08 NOTE — Progress Notes (Unsigned)
Gastroenterology Consultation  Referring Provider:     Shane Crutch, [place] Primary Care Physician:  Shane Crutch, [place] Primary Gastroenterologist:  Dr. Servando Snare     Reason for Consultation:     Rectal bleeding        HPI:   Melanie Vasquez is a 22 y.o. y/o female referred for consultation & management of rectal bleeding by Dr. Shane Crutch, PA.  This patient comes in today with a history of alternating diarrhea and constipation usually on a weekly basis.  The patient states that when she has the diarrhea she has bloody stools.  She states that she has lost 16 pounds in the last 2 weeks and also reports that she suffers from nausea which is usually at night.  The patient denies any vomiting or hematemesis.  She also denies any black stools.  She is concerned because she also has abdominal pain associated with increased bowel sounds.  She states that they can be heard across the room.  Past Medical History:  Diagnosis Date   Anxiety    Asthma    Depression     Past Surgical History:  Procedure Laterality Date   DILATION AND CURETTAGE OF UTERUS N/A 11/21/2020   Procedure: DILATATION AND CURETTAGE;  Surgeon: Conard Novak, MD;  Location: [facility] ORS;  Service: Gynecology;  Laterality: N/A;   IUD REMOVAL  11/21/2020   Procedure: incidental INTRAUTERINE DEVICE (IUD) REMOVAL;  Surgeon: Conard Novak, MD;  Location: [facility] ORS;  Service: Gynecology;;   TONSILLECTOMY     WISDOM TOOTH EXTRACTION      Prior to Admission medications   Medication Sig Start Date End Date Taking? Authorizing Provider  albuterol (VENTOLIN HFA) 108 (90 Base) MCG/ACT inhaler Inhale into the lungs.   Yes [provider]  Clindamycin-Benzoyl Per, Refr, gel 1 applicator at bedtime. 11/27/21  Yes [provider]  diphenhydrAMINE (BENADRYL) 50 MG capsule Take by mouth.   Yes [provider]  metroNIDAZOLE (FLAGYL) 500 MG tablet Take 1 tablet (500 mg total) by mouth 2 (two) times daily for 7  days. 06/06/23 06/13/23 Yes Shirlee Latch, PA-C  MIRENA, 52 MG, 20 MCG/DAY IUD  01/11/22  Yes [provider]  valACYclovir (VALTREX) 500 MG tablet Take 1 tablet po bid x 3 days as needed for an outbreak Patient not taking: Reported on 06/08/2023 08/24/21   [provider]    No family history on file.   Social History   Tobacco Use   Smoking status: Never   Smokeless tobacco: Never  Vaping Use   Vaping status: Former   Quit date: 03/11/2020  Substance Use Topics   Alcohol use: Not Currently    Comment: last drink in 02/22/20   Drug use: Never    Allergies as of 06/08/2023   (No Known Allergies)    Review of Systems:    All systems reviewed and negative except where noted in HPI.   Physical Exam:  BP 133/80 (BP Location: Left Wrist, Patient Position: Sitting, Cuff Size: Normal)   Pulse 83   Temp 98.4 F (36.9 C) (Oral)   Ht 5\' 6"  (1.676 m)   Wt 289 lb (131.1 kg)   BMI 46.65 kg/m  No LMP recorded. (Menstrual status: IUD). General:   Alert,  Well-developed, well-nourished, pleasant and cooperative in NAD Head:  Normocephalic and atraumatic. Eyes:  Sclera clear, no icterus.   Conjunctiva pink. Ears:  Normal auditory acuity. Neck:  Supple; no masses or thyromegaly. Lungs:  Respirations even and unlabored.  Clear throughout to auscultation.   No wheezes, crackles, or rhonchi. No acute distress. Heart:  Regular rate and rhythm; no murmurs, clicks, rubs, or gallops. Abdomen:  Normal bowel sounds.  No bruits.  Soft, non-tender and non-distended without masses, hepatosplenomegaly or hernias noted.  No guarding or rebound tenderness.  Negative Carnett sign.   Rectal:  Deferred.  Pulses:  Normal pulses noted. Extremities:  No clubbing or edema.  No cyanosis. Neurologic:  Alert and oriented x3;  grossly normal neurologically. Skin:  Intact without significant lesions or rashes.  No jaundice. Lymph Nodes:  No significant cervical adenopathy. Psych:  Alert and  cooperative. Normal mood and affect.  Imaging Studies: No results found.  Assessment and Plan:   [name] is a 22 y.o. y/o female who comes in with a history of obesity but has lost 16 pounds in the last 2 weeks with alternating diarrhea constipation with the diarrhea waking her up from sleep and rectal bleeding.  The patient has been told to avoid gassy foods and she will be set up for a colonoscopy to evaluate the cause of her change in bowel habits and diarrhea.  The patient has been explained the plan and agrees with it.    Midge Minium, MD. Clementeen Graham    Note: This dictation was prepared with Dragon dictation along with smaller phrase technology. Any transcriptional errors that result from this process are unintentional.

## 2023-06-29 ENCOUNTER — Encounter: Payer: Self-pay | Admitting: Gastroenterology

## 2023-06-29 ENCOUNTER — Ambulatory Visit: Payer: MEDICAID | Admitting: Certified Registered"

## 2023-06-29 ENCOUNTER — Encounter
Admission: RE | Disposition: A | Discharge status: Home / Self Care | Payer: Self-pay | Source: Home / Self Care | Attending: Gastroenterology

## 2023-06-29 ENCOUNTER — Ambulatory Visit
Admission: RE | Admit: 2023-06-29 | Discharge: 2023-06-29 | Disposition: A | Discharge status: Home / Self Care | Payer: MEDICAID | Attending: Gastroenterology | Admitting: Gastroenterology

## 2023-06-29 ENCOUNTER — Other Ambulatory Visit: Payer: Self-pay

## 2023-06-29 DIAGNOSIS — K921 Melena: Secondary | ICD-10-CM | POA: Diagnosis present

## 2023-06-29 DIAGNOSIS — K640 First degree hemorrhoids: Secondary | ICD-10-CM | POA: Diagnosis not present

## 2023-06-29 DIAGNOSIS — Z6841 Body Mass Index (BMI) 40.0 and over, adult: Secondary | ICD-10-CM | POA: Insufficient documentation

## 2023-06-29 DIAGNOSIS — K625 Hemorrhage of anus and rectum: Secondary | ICD-10-CM | POA: Diagnosis not present

## 2023-06-29 DIAGNOSIS — J45909 Unspecified asthma, uncomplicated: Secondary | ICD-10-CM | POA: Diagnosis not present

## 2023-06-29 DIAGNOSIS — Z87891 Personal history of nicotine dependence: Secondary | ICD-10-CM | POA: Insufficient documentation

## 2023-06-29 HISTORY — PX: COLONOSCOPY WITH PROPOFOL: SHX5780

## 2023-06-29 LAB — POCT PREGNANCY, URINE: Preg Test, Ur: NEGATIVE

## 2023-06-29 SURGERY — COLONOSCOPY WITH PROPOFOL
Anesthesia: General

## 2023-06-29 MED ORDER — PROPOFOL 10 MG/ML IV BOLUS
INTRAVENOUS | Status: DC | PRN
Start: 2023-06-29 — End: 2023-06-29
  Administered 2023-06-29: 40 mg via INTRAVENOUS

## 2023-06-29 MED ORDER — PROPOFOL 500 MG/50ML IV EMUL
INTRAVENOUS | Status: DC | PRN
  Administered 2023-06-29: 175 ug/kg/min via INTRAVENOUS

## 2023-06-29 MED ORDER — SODIUM CHLORIDE 0.9 % IV SOLN: INTRAVENOUS | Status: DC

## 2023-06-29 NOTE — Op Note (Signed)
[facility] Gastroenterology Patient Name: Melanie Vasquez Procedure Date: 06/29/2023 10:12 AM MRN: [id number] Account #: [id number] Date of Birth: 2001-03-23 Admit Type: Outpatient Age: 22 Room: [facility] ENDO ROOM 4 Gender: Female Note Status: Finalized Instrument Name: Prentice Docker [id number] Procedure:             Colonoscopy Indications:           Hematochezia Providers:             Midge Minium MD, MD Referring MD:          Shane Crutch PA Medicines:             Propofol per Anesthesia Complications:         No immediate complications. Procedure:             Pre-Anesthesia Assessment:                        - Prior to the procedure, a History and Physical was                         performed, and patient medications and allergies were                         reviewed. The patient's tolerance of previous                         anesthesia was also reviewed. The risks and benefits                         of the procedure and the sedation options and risks                         were discussed with the patient. All questions were                         answered, and informed consent was obtained. Prior                         Anticoagulants: The patient has taken no anticoagulant                         or antiplatelet agents. ASA Grade Assessment: II - A                         patient with mild systemic disease. After reviewing                         the risks and benefits, the patient was deemed in                         satisfactory condition to undergo the procedure.                        After obtaining informed consent, the colonoscope was                         passed under direct vision. Throughout the procedure,  the patient's blood pressure, pulse, and oxygen                         saturations were monitored continuously. The                         Colonoscope was introduced through the anus and                         advanced  to the the terminal ileum. The colonoscopy                         was performed without difficulty. The patient                         tolerated the procedure well. The quality of the bowel                         preparation was excellent. Findings:      The perianal and digital rectal examinations were normal.      Non-bleeding internal hemorrhoids were found during retroflexion. The       hemorrhoids were Grade I (internal hemorrhoids that do not prolapse).      The terminal ileum appeared normal. Impression:            - Non-bleeding internal hemorrhoids.                        - The examined portion of the ileum was normal.                        - No specimens collected. Recommendation:        - Discharge patient to home.                        - Resume previous diet.                        - Continue present medications.                        - High fiber diet.                        - If bleeding continues then consider banding Procedure Code(s):     --- Professional ---                        [phone], Colonoscopy, flexible; diagnostic, including                         collection of specimen(s) by brushing or washing, when                         performed (separate procedure) Diagnosis Code(s):     --- Professional ---                        K92.1, Melena (includes Hematochezia) CPT copyright 2022 American Medical Association. All rights reserved. The codes documented in this report are preliminary and upon coder review may  be revised to  meet current compliance requirements. Midge Minium MD, MD 06/29/2023 10:40:37 AM This report has been signed electronically. Number of Addenda: 0 Note Initiated On: 06/29/2023 10:12 AM Scope Withdrawal Time: 0 hours 7 minutes 10 seconds  Total Procedure Duration: 0 hours 10 minutes 3 seconds  Estimated Blood Loss:  Estimated blood loss: none.      [facility]

## 2023-06-29 NOTE — Anesthesia Procedure Notes (Signed)
Procedure Name: MAC Date/Time: 06/29/2023 10:21 AM  Performed by: Nelle Don, CRNAPre-anesthesia Checklist: Patient identified, Emergency Drugs available, Suction available and Patient being monitored Oxygen Delivery Method: Simple face mask

## 2023-06-29 NOTE — H&P (Signed)
Midge Minium, MD [facility] [address] [place], [place] [address] Phone:[phone] Fax : [phone]  Primary Care Physician:  Shane Crutch, [place] Primary Gastroenterologist:  Dr. Servando Snare  Pre-Procedure History & Physical: HPI:  Melanie Vasquez is a 22 y.o. female is here for an colonoscopy.   Past Medical History:  Diagnosis Date   Anxiety    Asthma    Depression     Past Surgical History:  Procedure Laterality Date   DILATION AND CURETTAGE OF UTERUS N/A 11/21/2020   Procedure: DILATATION AND CURETTAGE;  Surgeon: Conard Novak, MD;  Location: [facility] ORS;  Service: Gynecology;  Laterality: N/A;   IUD REMOVAL  11/21/2020   Procedure: incidental INTRAUTERINE DEVICE (IUD) REMOVAL;  Surgeon: Conard Novak, MD;  Location: [facility] ORS;  Service: Gynecology;;   TONSILLECTOMY     WISDOM TOOTH EXTRACTION      Prior to Admission medications   Medication Sig Start Date End Date Taking? Authorizing Provider  albuterol (VENTOLIN HFA) 108 (90 Base) MCG/ACT inhaler Inhale into the lungs.   Yes [provider]  Clindamycin-Benzoyl Per, Refr, gel 1 applicator at bedtime. 11/27/21  Yes [provider]  diphenhydrAMINE (BENADRYL) 50 MG capsule Take by mouth.   Yes [provider]  MIRENA, 52 MG, 20 MCG/DAY IUD  01/11/22   [provider]  valACYclovir (VALTREX) 500 MG tablet Take 1 tablet po bid x 3 days as needed for an outbreak Patient not taking: Reported on 06/08/2023 08/24/21   [provider]    Allergies as of 06/08/2023   (No Known Allergies)    History reviewed. No pertinent family history.  Social History   Socioeconomic History   Marital status: Single    Spouse name: Not on file   Number of children: Not on file   Years of education: Not on file   Highest education level: Not on file  Occupational History   Not on file  Tobacco Use   Smoking status: Never   Smokeless tobacco: Never  Vaping Use   Vaping status: Former    Quit date: 03/11/2020  Substance and Sexual Activity   Alcohol use: Not Currently    Comment: last drink in 02/22/20   Drug use: Never   Sexual activity: Not on file  Other Topics Concern   Not on file  Social History Narrative   Not on file   Social Determinants of Health   Financial Resource Strain: Not on file  Food Insecurity: Not on file  Transportation Needs: Not on file  Physical Activity: Not on file  Stress: Not on file  Social Connections: Not on file  Intimate Partner Violence: Not At Risk (10/30/2021)   Received from [facility], [facility]   Humiliation, Afraid, Rape, and Kick questionnaire    Fear of Current or Ex-Partner: No    Emotionally Abused: No    Physically Abused: No    Sexually Abused: No    Review of Systems: See HPI, otherwise negative ROS  Physical Exam: BP 136/85   Pulse 89   Temp 97.6 F (36.4 C) (Temporal)   Ht 5\' 6"  (1.676 m)   Wt 127 kg   SpO2 99%   BMI 45.19 kg/m  General:   Alert,  pleasant and cooperative in NAD Head:  Normocephalic and atraumatic. Neck:  Supple; no masses or thyromegaly. Lungs:  Clear throughout to auscultation.    Heart:  Regular rate and rhythm. Abdomen:  Soft, nontender and nondistended. Normal  bowel sounds, without guarding, and without rebound.   Neurologic:  Alert and  oriented x4;  grossly normal neurologically.  Impression/Plan: Melanie Vasquez is here for an colonoscopy to be performed for rectal bleeding  Risks, benefits, limitations, and alternatives regarding  colonoscopy have been reviewed with the patient.  Questions have been answered.  All parties agreeable.   Midge Minium, MD  06/29/2023, 10:08 AM

## 2023-06-29 NOTE — Transfer of Care (Signed)
Immediate Anesthesia Transfer of Care Note  Patient: Melanie Vasquez  Procedure(s) Performed: COLONOSCOPY WITH PROPOFOL  Patient Location: PACU  Anesthesia Type:General  Level of Consciousness: awake, alert , and oriented  Airway & Oxygen Therapy: Patient Spontanous Breathing and Patient connected to face mask oxygen  Post-op Assessment: Report given to RN, Post -op Vital signs reviewed and stable, and Patient moving all extremities X 4  Post vital signs: Reviewed and stable  Last Vitals:  Vitals Value Taken Time  BP 110/50 06/29/23 1042  Temp    Pulse 74 06/29/23 1042  Resp 20 06/29/23 1042  SpO2 100 % 06/29/23 1042    Last Pain:  Vitals:   06/29/23 0927  TempSrc: Temporal         Complications: No notable events documented.

## 2023-06-29 NOTE — Anesthesia Preprocedure Evaluation (Signed)
Anesthesia Evaluation  Patient identified by MRN, date of birth, ID band Patient awake    Reviewed: Allergy & Precautions, H&P , NPO status , Patient's Chart, lab work & pertinent test results, reviewed documented beta blocker date and time   History of Anesthesia Complications Negative for: history of anesthetic complications  Airway Mallampati: IV  TM Distance: >3 FB Neck ROM: full    Dental  (+) Dental Advidsory Given, Teeth Intact   Pulmonary neg shortness of breath, asthma , Continuous Positive Airway Pressure Ventilation neg sleep apnea, neg COPD, neg recent URI, Current Smoker   Pulmonary exam normal breath sounds clear to auscultation       Cardiovascular Exercise Tolerance: Good negative cardio ROS Normal cardiovascular exam Rhythm:regular Rate:Normal     Neuro/Psych negative neurological ROS  negative psych ROS   GI/Hepatic Neg liver ROS,GERD  ,,  Endo/Other  neg diabetes  Morbid obesity  Renal/GU negative Renal ROS  negative genitourinary   Musculoskeletal   Abdominal   Peds  Hematology negative hematology ROS (+)   Anesthesia Other Findings Past Medical History: No date: Anxiety No date: Asthma No date: Depression   Reproductive/Obstetrics negative OB ROS                             Anesthesia Physical Anesthesia Plan  ASA: 3  Anesthesia Plan: General   Post-op Pain Management:    Induction: Intravenous  PONV Risk Score and Plan: 2 and Propofol infusion, TIVA and Treatment may vary due to age or medical condition  Airway Management Planned: Natural Airway and Nasal Cannula  Additional Equipment:   Intra-op Plan:   Post-operative Plan:   Informed Consent: I have reviewed the patients History and Physical, chart, labs and discussed the procedure including the risks, benefits and alternatives for the proposed anesthesia with the patient or authorized  representative who has indicated his/her understanding and acceptance.     Dental Advisory Given  Plan Discussed with: Anesthesiologist, CRNA and Surgeon  Anesthesia Plan Comments:        Anesthesia Quick Evaluation

## 2023-06-30 ENCOUNTER — Encounter: Payer: Self-pay | Admitting: Gastroenterology

## 2023-07-05 ENCOUNTER — Telehealth: Payer: Self-pay

## 2023-07-05 NOTE — Telephone Encounter (Signed)
Patient left a voicemail stating she would like a call back because she had some questions. She states she has been having painful bowel movements. She states she is not constipated and the stool are soft and hard. She states she has bowel movements every day. She is still having rectal bleeding with every bowel movement in the toilet bowel.  Started taking otc stool softeners on Friday.   She saw Dr. Servando Snare on 06/08/2023 rectal bleeding and had colonoscopy 06/29/2023

## 2023-07-06 ENCOUNTER — Other Ambulatory Visit: Payer: Self-pay

## 2023-07-06 MED ORDER — HYDROCORTISONE ACETATE 25 MG RE SUPP: 25.0000 mg | Freq: Two times a day (BID) | RECTAL | 0 refills | Status: DC

## 2023-07-06 NOTE — Addendum Note (Signed)
Addended by: Roena Malady on: 07/06/2023 02:25 PM   Modules accepted: Orders

## 2023-07-09 NOTE — Anesthesia Postprocedure Evaluation (Signed)
Anesthesia Post Note  Patient: Melanie Vasquez  Procedure(s) Performed: COLONOSCOPY WITH PROPOFOL  Patient location during evaluation: Endoscopy Anesthesia Type: General Level of consciousness: awake and alert Pain management: pain level controlled Vital Signs Assessment: post-procedure vital signs reviewed and stable Respiratory status: spontaneous breathing, nonlabored ventilation, respiratory function stable and patient connected to nasal cannula oxygen Cardiovascular status: blood pressure returned to baseline and stable Postop Assessment: no apparent nausea or vomiting Anesthetic complications: no   No notable events documented.   Last Vitals:  Vitals:   06/29/23 1052 06/29/23 1102  BP: (!) 91/54 (!) 95/43  Pulse: 75 66  Resp: 19 (!) 24  Temp:    SpO2: 100% 100%    Last Pain:  Vitals:   06/29/23 1102  TempSrc:   PainSc: 0-No pain                 Lenard Simmer

## 2023-07-13 DIAGNOSIS — R748 Abnormal levels of other serum enzymes: Secondary | ICD-10-CM | POA: Insufficient documentation

## 2023-07-14 ENCOUNTER — Other Ambulatory Visit: Payer: Self-pay | Admitting: Primary Care

## 2023-07-14 DIAGNOSIS — R748 Abnormal levels of other serum enzymes: Secondary | ICD-10-CM

## 2023-07-20 ENCOUNTER — Ambulatory Visit
Admission: RE | Admit: 2023-07-20 | Discharge: 2023-07-20 | Disposition: A | Discharge status: Home / Self Care | Payer: MEDICAID | Source: Ambulatory Visit | Attending: Primary Care | Admitting: Primary Care

## 2023-07-20 DIAGNOSIS — R748 Abnormal levels of other serum enzymes: Secondary | ICD-10-CM | POA: Insufficient documentation

## 2023-08-29 DIAGNOSIS — Z0000 Encounter for general adult medical examination without abnormal findings: Secondary | ICD-10-CM | POA: Insufficient documentation

## 2024-05-10 ENCOUNTER — Ambulatory Visit (INDEPENDENT_AMBULATORY_CARE_PROVIDER_SITE_OTHER): Payer: MEDICAID | Admitting: Family Medicine

## 2024-05-10 ENCOUNTER — Encounter: Payer: Self-pay | Admitting: Family Medicine

## 2024-05-10 VITALS — BP 115/73 | HR 94 | Resp 16 | Ht 66.0 in | Wt 261.0 lb

## 2024-05-10 DIAGNOSIS — Z7689 Persons encountering health services in other specified circumstances: Secondary | ICD-10-CM

## 2024-05-10 DIAGNOSIS — R1011 Right upper quadrant pain: Secondary | ICD-10-CM | POA: Diagnosis not present

## 2024-05-10 DIAGNOSIS — N393 Stress incontinence (female) (male): Secondary | ICD-10-CM

## 2024-05-10 MED ORDER — MIRABEGRON ER 25 MG PO TB24
25.0000 mg | ORAL_TABLET | Freq: Every day | ORAL | 2 refills | Status: DC
Start: 2024-05-10 — End: 2024-05-20

## 2024-05-10 NOTE — Progress Notes (Signed)
 New Patient Office Visit  Subjective   Patient ID: Melanie Vasquez, female    DOB: 04-19-2001  Age: 23 y.o. MRN: [id number]  CC:  Chief Complaint  Patient presents with   Establish Care    Pain on Right lower abdomen x 3 months after eating worried about gallbladder- Podiatry Ref Requested Fungal Inf on R foot.    HPI Melanie Vasquez is a 23 year old female who presents to establish with [facility] at [facility].   CC: Patient here to establish care  PMHx: anxiety/MDD, asthma, obesity, GERD   ABDOMINAL PAIN: Onset: chronic x3 months  Location:  R upper quadrants  Radiation: radiates to her back  Severity: 6-7/10 Alleviating factors: no  Aggravating factors:after eating, usually dinner  Treatments tried: omeprazole  Follows a Mediterranean diet- no fried food,  Has been on Wegovy for a while  Stress incontinence- experiencing   Outpatient Encounter Medications as of 05/10/2024  Medication Sig   albuterol (VENTOLIN HFA) 108 (90 Base) MCG/ACT inhaler Inhale into the lungs.   ibuprofen (ADVIL) 600 MG tablet Take 600 mg by mouth.   mirabegron ER (MYRBETRIQ) 25 MG TB24 tablet Take 1 tablet (25 mg total) by mouth daily.   MIRENA, 52 MG, 20 MCG/DAY IUD    semaglutide-weight management (WEGOVY) 2.4 MG/0.75ML SOAJ SQ injection Inject 2.4 mg into the skin.   acetaminophen (TYLENOL) 500 MG tablet Take 500 mg by mouth.   [DISCONTINUED] Clindamycin-Benzoyl Per, Refr, gel 1 applicator at bedtime. (Patient not taking: Reported on 05/10/2024)   [DISCONTINUED] diphenhydrAMINE (BENADRYL) 50 MG capsule Take by mouth. (Patient not taking: Reported on 05/10/2024)   [DISCONTINUED] hydrocortisone (ANUSOL-HC) 25 MG suppository Place 1 suppository (25 mg total) rectally 2 (two) times daily. (Patient not taking: Reported on 05/10/2024)   [DISCONTINUED] metFORMIN (GLUCOPHAGE) 500 MG tablet Take 500 mg by mouth daily with breakfast. (Patient not taking: Reported on 05/10/2024)   [DISCONTINUED]  valACYclovir (VALTREX) 500 MG tablet Take 1 tablet po bid x 3 days as needed for an outbreak (Patient not taking: Reported on 05/10/2024)   No facility-administered encounter medications on file as of 05/10/2024.    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   Family history of bipolar disorder 04/27/2016   Family history of pulmonary fibrosis 04/27/2016   Past Medical History:  Diagnosis Date   Anxiety    Asthma    Depression    Obesity    Past Surgical History:  Procedure Laterality Date   COLONOSCOPY WITH PROPOFOL N/A 06/29/2023   Procedure: COLONOSCOPY WITH PROPOFOL;  Surgeon: Jinny Carmine, MD;  Location: [facility] ENDOSCOPY;  Service: Endoscopy;  Laterality: N/A;   DILATION AND CURETTAGE OF UTERUS N/A 11/21/2020   Procedure: DILATATION AND CURETTAGE;  Surgeon: Leonce Garnette BIRCH, MD;  Location: [facility] ORS;  Service: Gynecology;  Laterality: N/A;   IUD REMOVAL  11/21/2020   Procedure: incidental INTRAUTERINE DEVICE (IUD) REMOVAL;  Surgeon: Leonce Garnette BIRCH, MD;  Location: [facility] ORS;  Service: Gynecology;;   TONSILLECTOMY     WISDOM TOOTH EXTRACTION     Family History  Adopted: Yes   Social History   Socioeconomic History   Marital status: Single    Spouse name: Not on  file   Number of children: Not on file   Years of education: Not on file   Highest education level: Not on file  Occupational History   Not on file  Tobacco Use   Smoking status: Never    Passive exposure: Never   Smokeless tobacco: Never  Vaping Use   Vaping status: Former   Quit date: 03/11/2020  Substance and Sexual Activity   Alcohol use: Not Currently    Comment: last drink in 02/22/20   Drug  use: Never   Sexual activity: Yes    Birth control/protection: I.U.D.  Other Topics Concern   Not on file  Social History Narrative   Not on file   Social Drivers of Health   Financial Resource Strain: Not on file  Food Insecurity: Not on file  Transportation Needs: Not on file  Physical Activity: Not on file  Stress: Not on file  Social Connections: Not on file  Intimate Partner Violence: Not At Risk (02/01/2024)   Received from [facility]   Humiliation, Afraid, Rape, and Kick questionnaire    Within the last year, have you been afraid of your partner or ex-partner?: No    Within the last year, have you been humiliated or emotionally abused in other ways by your partner or ex-partner?: No    Within the last year, have you been kicked, hit, slapped, or otherwise physically hurt by your partner or ex-partner?: No    Within the last year, have you been raped or forced to have any kind of sexual activity by your partner or ex-partner?: No   Outpatient Medications Prior to Visit  Medication Sig Dispense Refill   albuterol  (VENTOLIN  HFA) 108 (90 Base) MCG/ACT inhaler Inhale into the lungs.     ibuprofen  (ADVIL ) 600 MG tablet Take 600 mg by mouth.     MIRENA, 52 MG, 20 MCG/DAY IUD      semaglutide-weight management (WEGOVY) 2.4 MG/0.75ML SOAJ SQ injection Inject 2.4 mg into the skin.     acetaminophen  (TYLENOL ) 500 MG tablet Take 500 mg by mouth.     Clindamycin-Benzoyl Per, Refr, gel 1 applicator at bedtime. (Patient not taking: Reported on 05/10/2024)     diphenhydrAMINE  (BENADRYL ) 50 MG capsule Take by mouth. (Patient not taking: Reported on 05/10/2024)     hydrocortisone  (ANUSOL -HC) 25 MG suppository Place 1 suppository (25 mg total) rectally 2 (two) times daily. (Patient not taking: Reported on 05/10/2024) 12 suppository 0   metFORMIN (GLUCOPHAGE) 500 MG tablet Take 500 mg by mouth daily with breakfast. (Patient not taking: Reported on 05/10/2024)     valACYclovir  (VALTREX ) 500 MG  tablet Take 1 tablet po bid x 3 days as needed for an outbreak (Patient not taking: Reported on 05/10/2024)     No facility-administered medications prior to visit.   No Known Allergies  ROS: see HPI     Objective   Today's Vitals   05/10/24 0837  BP: 115/73  Pulse: 94  Resp: 16  SpO2: 98%  Weight: 261 lb (118.4 kg)  Height: 5' 6 (1.676 m)  PainSc: 0-No pain    GENERAL: Well-appearing, in NAD. Well nourished.  SKIN: Pink, warm and dry. No rash, lesion, ulceration, or ecchymoses.  Head: Normocephalic. NECK: Trachea midline. Full ROM w/o pain or tenderness. No lymphadenopathy.  EARS: Tympanic membranes are intact, translucent without bulging and without drainage. Appropriate landmarks visualized.  EYES: Conjunctiva clear without exudates. EOMI, PERRL, no drainage present.  NOSE: Septum midline w/o deformity. Nares  patent, mucosa pink and non-inflamed w/o drainage. No sinus tenderness.  THROAT: Uvula midline. Oropharynx clear. Tonsils non-inflamed without exudate. Mucous membranes pink and moist.  RESPIRATORY: Chest wall symmetrical. Respirations even and non-labored. Breath sounds clear to auscultation bilaterally.  CARDIAC: S1, S2 present, regular rate and rhythm without murmur or gallops. Peripheral pulses 2+ bilaterally.  MSK: Muscle tone and strength appropriate for age. Joints w/o tenderness, redness, or swelling.  EXTREMITIES: Without clubbing, cyanosis, or edema.  NEUROLOGIC: No motor or sensory deficits. Steady, even gait. C2-C12 intact.  PSYCH/MENTAL STATUS: Alert, oriented x 3. Cooperative, appropriate mood and affect.     Assessment & Plan:   1. Encounter to establish care (Primary) Patient is a 28- year-old female who presents today to establish care with primary care at [facility]. Reviewed the past medical history, family history, social history, surgical history, medications and allergies today- updates made as indicated. Patient has concerns today about abdominal  pain and stress incontinence.   2. Postprandial RUQ pain Vital signs stable. Patient is well-appearing and in no acute distress. Physical exam reveals tenderness present in the RUQ with slight nausea. No rebound tenderness or guarding present.  Initial testing will include assessment of liver and pancreatic function. Discussed that imaging would be reasonable if the lab work does not show any acute findings. US  of RUQ placed. Patient is agreeable to plan of care. Counseled regarding ED precautions and when to return to office.  - US  Abdomen Limited RUQ (LIVER/GB); Future - Lipase - Amylase - Hepatic function panel  3. Stress incontinence Patient presents today with urine dribbling with coughing, sneezing or laughing occasionally. She reports this is bothersome and would be interested in medication. Will trial oxybutynin at this time.  - oxybutynin (DITROPAN) 5 MG tablet; Take 1 tablet (5 mg total) by mouth at bedtime.  Dispense: 30 tablet; Refill: 2   Return in about 4 weeks (around 06/07/2024) for abd pain & incontinence .   Evalene Arts, FNP

## 2024-05-10 NOTE — Patient Instructions (Signed)
 MyChart:  For all urgent or time sensitive needs we ask that you please call the office to avoid delays. Our number is [phone] [phone]. MyChart is not constantly monitored and due to the large volume of messages a day, replies may take up to 72 business hours.   MyChart Policy: MyChart allows for you to see your visit notes, after visit summary, provider recommendations, lab and tests results, make an appointment, request refills, and contact your provider or the office for non-urgent questions or concerns. Providers are seeing patients during normal business hours and do not have built in time to review MyChart messages.  We ask that you allow a minimum of 3 business days for responses to [employer]. For this reason, please do not send urgent requests through MyChart. Please call the office at [phone]. New and ongoing conditions may require a visit. We have virtual and in person visit available for your convenience.  Complex MyChart concerns may require a visit. Your provider may request you schedule a virtual or in person visit to ensure we are providing the best care possible. MyChart messages sent after 11:00 AM on Friday will not be received by the provider until Monday morning.    Lab and Test Results: You will receive your lab and test results on MyChart as soon as they are completed and results have been sent by the lab or testing facility. Due to this service, you will receive your results BEFORE your provider.  I review lab and tests results each morning prior to seeing patients. Some results require collaboration with other providers to ensure you are receiving the most appropriate care. For this reason, we ask that you please allow a minimum of 3-5 business days from the time the ALL results have been received for your provider to receive and review lab and test results and contact you about these.  Most lab and test result comments from the provider will be sent through MyChart.  Your provider may recommend changes to the plan of care, follow-up visits, repeat testing, ask questions, or request an office visit to discuss these results. You may reply directly to this message or call the office at [phone] to provide information for the provider or set up an appointment. In some instances, you will be called with test results and recommendations. Please let us  know if this is preferred and we will make note of this in your chart to provide this for you.    If you have not heard a response to your lab or test results in 5 business days from all results returning to MyChart, please call the office to let us  know. We ask that you please avoid calling prior to this time unless there is an emergent concern. Due to high call volumes, this can delay the resulting process.   After Hours: For all non-emergency after hours needs, please call the office at [phone] and select the option to reach the on-call provider service. On-call services are shared between multiple [place] offices and therefore it will not be possible to speak directly with your provider. On-call providers may provide medical advice and recommendations, but are unable to provide refills for maintenance medications.  For all emergency or urgent medical needs after normal business hours, we recommend that you seek care at the closest Urgent Care or Emergency Department to ensure appropriate treatment in a timely manner.  [facility] [place] has a 24 hour emergency room located on the ground floor for your  convenience.    Urgent Concerns During the Business Day Providers are seeing patients from 8AM to 5PM, Monday through Thursday, and 8AM to 12PM on Friday with a busy schedule and are most often not able to respond to non-urgent calls until the end of the day or the next business day. If you should have URGENT concerns during the day, please call and speak to the nurse or schedule a same day  appointment so that we can address your concern without delay.    Thank you, again, for choosing me as your health care partner. I appreciate your trust and look forward to learning more about you.    Evalene Arts, FNP-C

## 2024-05-11 ENCOUNTER — Ambulatory Visit: Payer: Self-pay | Admitting: Family Medicine

## 2024-05-11 LAB — HEPATIC FUNCTION PANEL
ALT: 49 IU/L — ABNORMAL HIGH (ref 0–32)
AST: 31 IU/L (ref 0–40)
Albumin: 4.4 g/dL (ref 4.0–5.0)
Alkaline Phosphatase: 68 IU/L (ref 44–121)
Bilirubin Total: 0.2 mg/dL (ref 0.0–1.2)
Bilirubin, Direct: 0.11 mg/dL (ref 0.00–0.40)
Total Protein: 7.4 g/dL (ref 6.0–8.5)

## 2024-05-11 LAB — LIPASE: Lipase: 68 U/L (ref 14–72)

## 2024-05-11 LAB — AMYLASE: Amylase: 58 U/L (ref 31–110)

## 2024-05-14 ENCOUNTER — Ambulatory Visit
Admission: RE | Admit: 2024-05-14 | Discharge: 2024-05-14 | Disposition: A | Discharge status: Home / Self Care | Payer: MEDICAID | Source: Ambulatory Visit | Attending: Family Medicine | Admitting: Family Medicine

## 2024-05-14 DIAGNOSIS — R1011 Right upper quadrant pain: Secondary | ICD-10-CM

## 2024-05-16 ENCOUNTER — Telehealth: Payer: Self-pay

## 2024-05-16 NOTE — Telephone Encounter (Signed)
(  Key: [id number])  PA Case ID #: [id number]  Rx #: [id number]  Need Help? Call us  at [phone]  Status Sent to Plan today  Drug Mirabegron  ER 25MG  er tablets  Form [employer] ePA Form (2017 NCPDP) Original Claim Info 29 Dispense a 72-hour supply, if appropriate, while awaiting PA. Pharmacy help desk ph.# [phone];Prior Authorization Required     Oxybutynin, Tospium, Solifenaci, Bethanechol and Tolterodine are preferred formularies

## 2024-05-20 MED ORDER — OXYBUTYNIN CHLORIDE 5 MG PO TABS: 5.0000 mg | ORAL_TABLET | Freq: Every day | ORAL | 2 refills | Status: AC

## 2024-06-08 ENCOUNTER — Ambulatory Visit: Payer: MEDICAID | Admitting: Family Medicine

## 2024-06-11 ENCOUNTER — Ambulatory Visit: Payer: MEDICAID | Admitting: Family Medicine

## 2024-08-16 ENCOUNTER — Encounter: Payer: Self-pay | Admitting: Family Medicine

## 2024-08-16 ENCOUNTER — Ambulatory Visit (INDEPENDENT_AMBULATORY_CARE_PROVIDER_SITE_OTHER): Payer: MEDICAID | Admitting: Family Medicine

## 2024-08-16 DIAGNOSIS — B369 Superficial mycosis, unspecified: Secondary | ICD-10-CM | POA: Diagnosis not present

## 2024-08-16 DIAGNOSIS — B351 Tinea unguium: Secondary | ICD-10-CM

## 2024-08-16 MED ORDER — CLOTRIMAZOLE 1 % EX CREA: 1.0000 | TOPICAL_CREAM | Freq: Two times a day (BID) | CUTANEOUS | 0 refills | Status: AC

## 2024-08-16 NOTE — Progress Notes (Signed)
 Established Patient Office Visit  Subjective  Patient ID: Melanie Vasquez, female    DOB: [birth year]  Age: 24 y.o. MRN: [id number]  Chief Complaint  Patient presents with   Obesity    Referral for Weight loss surgery   Discussed the use of AI scribe software for clinical note transcription with the patient, who gave verbal consent to proceed.  History of Present Illness   Melanie Vasquez is a 23 year old female who presents about weight management, with difficulty losing weight despite diet and exercise.  She has been adhering to a Mediterranean diet for nearly two years and engages in exercise twice daily. Despite these efforts, her weight has plateaued, and she has recently gained four pounds since her last visit. Her weight has fluctuated between 240 and 322 pounds. She previously used Wegovy starting in November of last year, which resulted in significant weight loss, but she has not experienced further weight reduction. She discontinued metformin in July of this year and is still taking Wegovy maximum dose without decrease in weight. She exercises in the morning and the evening. Her highest weight was 322lbs; her lowest weight has been 240lbs.   She experiences severe rashes and blistering between the bottom of her stomach and pelvis, as well as on her thighs. The rashes are described as painful, akin to 'throwing alcohol on paper cuts', but not itchy. The condition improves and worsens intermittently.  ROS: see HPI     Objective:     BP 124/71   Pulse 77   Resp 16   Ht 5' 6 (1.676 m)   Wt 265 lb (120.2 kg)   SpO2 98%   BMI 42.77 kg/m  BP Readings from Last 3 Encounters:  08/16/24 124/71  05/10/24 115/73  06/29/23 (!) 95/43     Physical Exam Vitals reviewed.  Constitutional:      Appearance: Normal appearance. She is obese.  Cardiovascular:     Rate and Rhythm: Normal rate and regular rhythm.     Pulses: Normal pulses.     Heart sounds: Normal heart sounds.   Pulmonary:     Effort: Pulmonary effort is normal.     Breath sounds: Normal breath sounds.  Neurological:     Mental Status: She is alert.  Psychiatric:        Mood and Affect: Mood normal.        Behavior: Behavior normal.     Assessment & Plan:   1. Morbid obesity (HCC) (Primary) Morbid obesity due to excess calories. Weight loss plateaued despite healthy nutrition/diet and daily exercise. Previous A1c normal. She is currently on 2.4mg  Wegovy weekly. Referred to bariatric surgery for evaluation and management. - Amb Referral to Bariatric Surgery  2. Fungal infection of skin Intertrigo with recurrent rash and blistering of lower abdomen and thighs. Recurrent rash likely due to skin friction and possible yeast infection. Severe pain without itching, resembling paper cuts. Prescribed antifungal cream twice daily to affected areas. Advised use of Desitin for additional skin healing if needed. Instructed to report if symptoms do not improve for consideration of alternative treatments such as nystatin. - clotrimazole  (CLOTRIMAZOLE  ANTI-FUNGAL) 1 % cream; Apply 1 Application topically 2 (two) times daily.  Dispense: 113 g; Refill: 0  3. Toenail fungus Suspected onychomycosis of the toenails on the right foot. Sent referral to podiatrist for evaluation and management of toenail fungus.  - Ambulatory referral to Podiatry   Return if symptoms worsen or fail to improve.  Evalene Arts, FNP

## 2024-08-16 NOTE — Patient Instructions (Signed)

## 2024-08-21 ENCOUNTER — Ambulatory Visit: Payer: Self-pay | Admitting: Podiatry

## 2024-09-02 ENCOUNTER — Emergency Department
Admission: EM | Admit: 2024-09-02 | Discharge: 2024-09-03 | Disposition: A | Payer: MEDICAID | Attending: Emergency Medicine | Admitting: Emergency Medicine

## 2024-09-02 DIAGNOSIS — G43009 Migraine without aura, not intractable, without status migrainosus: Secondary | ICD-10-CM

## 2024-09-02 DIAGNOSIS — G935 Compression of brain: Secondary | ICD-10-CM

## 2024-09-02 LAB — CBC
HCT: 42 % (ref 36.0–46.0)
Hemoglobin: 13.9 g/dL (ref 12.0–15.0)
MCH: 29 pg (ref 26.0–34.0)
MCHC: 33.1 g/dL (ref 30.0–36.0)
MCV: 87.5 fL (ref 80.0–100.0)
Platelets: 263 K/uL (ref 150–400)
RBC: 4.8 MIL/uL (ref 3.87–5.11)
RDW: 12.7 % (ref 11.5–15.5)
WBC: 8.9 K/uL (ref 4.0–10.5)
nRBC: 0 % (ref 0.0–0.2)

## 2024-09-02 LAB — COMPREHENSIVE METABOLIC PANEL WITH GFR
ALT: 80 U/L — ABNORMAL HIGH (ref 0–44)
AST: 40 U/L (ref 15–41)
Albumin: 4.7 g/dL (ref 3.5–5.0)
Alkaline Phosphatase: 62 U/L (ref 38–126)
Anion gap: 15 (ref 5–15)
BUN: 9 mg/dL (ref 6–20)
CO2: 22 mmol/L (ref 22–32)
Calcium: 9.5 mg/dL (ref 8.9–10.3)
Chloride: 101 mmol/L (ref 98–111)
Creatinine, Ser: 0.63 mg/dL (ref 0.44–1.00)
GFR, Estimated: >60 mL/min (ref >60)
Glucose, Bld: 72 mg/dL (ref 70–99)
Potassium: 3.6 mmol/L (ref 3.5–5.1)
Sodium: 138 mmol/L (ref 135–145)
Total Bilirubin: 0.6 mg/dL (ref 0.0–1.2)
Total Protein: 8 g/dL (ref 6.5–8.1)

## 2024-09-02 LAB — LIPASE, BLOOD: Lipase: 26 U/L (ref 11–51)

## 2024-09-02 LAB — POC URINE PREG, ED: Preg Test, Ur: NEGATIVE

## 2024-09-02 NOTE — ED Triage Notes (Signed)
 Pt here for right sided abdominal pain that started 2 days ago, but has worsened. Pt notes hx of similar event 3-4 prior (notes concern for CSF leak). Pt denies V/D/fever/CP/SOB/hematuria/vaginal discharge/UTI symptoms. Appears well. No meds PTA. 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

## 2024-09-02 NOTE — ED Provider Notes (Signed)
 [facility] Provider Note    Event Date/Time   First MD Initiated Contact with Patient 09/02/24 2341     (approximate)   History   Abdominal Pain   HPI  Melanie Vasquez is a 23 y.o. female with history of depression, anxiety, asthma, obesity, Chiari malformation followed by [facility] neurosurgery who presents to the emergency department with a migraine headache.  History of the same.  No relief with medications over-the-counter.  This is not any different than her normal headaches.  No fever, neck pain or neck stiffness.  No new numbness, tingling or weakness.  No head injury.  Not on blood thinners.  Has had nausea and vomiting.  No diarrhea.  No dysuria, hematuria, vaginal bleeding or discharge.  States she did have some abdominal pain earlier that has resolved.   History provided by patient.    Past Medical History:  Diagnosis Date   Anxiety    Asthma    Depression    Obesity     Past Surgical History:  Procedure Laterality Date   COLONOSCOPY WITH PROPOFOL  N/A 06/29/2023   Procedure: COLONOSCOPY WITH PROPOFOL ;  Surgeon: Jinny Carmine, MD;  Location: [facility] ENDOSCOPY;  Service: Endoscopy;  Laterality: N/A;   DILATION AND CURETTAGE OF UTERUS N/A 11/21/2020   Procedure: DILATATION AND CURETTAGE;  Surgeon: Leonce Garnette BIRCH, MD;  Location: [facility] ORS;  Service: Gynecology;  Laterality: N/A;   IUD REMOVAL  11/21/2020   Procedure: incidental INTRAUTERINE DEVICE (IUD) REMOVAL;  Surgeon: Leonce Garnette BIRCH, MD;  Location: [facility] ORS;  Service: Gynecology;;   TONSILLECTOMY     WISDOM TOOTH EXTRACTION      MEDICATIONS:  Prior to Admission medications   Medication Sig Start Date End Date Taking? Authorizing Provider  acetaminophen  (TYLENOL ) 500 MG tablet Take 500 mg by mouth.    [provider]  albuterol  (VENTOLIN  HFA) 108 (90 Base) MCG/ACT inhaler Inhale into the lungs.    [provider]  clotrimazole  (CLOTRIMAZOLE  ANTI-FUNGAL) 1 % cream Apply 1  Application topically 2 (two) times daily. 08/16/24   Towana Small, FNP  ibuprofen  (ADVIL ) 600 MG tablet Take 600 mg by mouth. 01/09/24   [provider]  MIRENA, 52 MG, 20 MCG/DAY IUD  01/11/22   [provider]  oxybutynin  (DITROPAN ) 5 MG tablet Take 1 tablet (5 mg total) by mouth at bedtime. 05/20/24   Towana Small, FNP  semaglutide-weight management (WEGOVY) 2.4 MG/0.75ML SOAJ SQ injection Inject 2.4 mg into the skin.    [provider]    Physical Exam   Triage Vital Signs: ED Triage Vitals  Encounter Vitals Group     BP 09/02/24 2241 134/83     Girls Systolic BP Percentile --      Girls Diastolic BP Percentile --      Boys Systolic BP Percentile --      Boys Diastolic BP Percentile --      Pulse Rate 09/02/24 2241 79     Resp 09/02/24 2241 17     Temp 09/02/24 2241 98.9 F (37.2 C)     Temp Source 09/02/24 2241 Oral     SpO2 09/02/24 2241 100 %     Weight 09/02/24 2236 269 lb (122 kg)     Height 09/02/24 2236 5' 6 (1.676 m)     Head Circumference --      Peak Flow --      Pain Score 09/02/24 2235 6     Pain Loc --  Pain Education --      Exclude from Growth Chart --     Most recent vital signs: Vitals:   09/02/24 2241 09/03/24 0215  BP: 134/83 130/80  Pulse: 79 87  Resp: 17 18  Temp: 98.9 F (37.2 C) 98.8 F (37.1 C)  SpO2: 100% 100%    CONSTITUTIONAL: Alert, responds appropriately to questions. Well-appearing; well-nourished HEAD: Normocephalic, atraumatic EYES: Conjunctivae clear, pupils appear equal, sclera nonicteric ENT: normal nose; moist mucous membranes, no leaking fluid or rhinorrhea NECK: Supple, normal ROM, no meningismus CARD: RRR; S1 and S2 appreciated RESP: Normal chest excursion without splinting or tachypnea; breath sounds clear and equal bilaterally; no wheezes, no rhonchi, no rales, no hypoxia or respiratory distress, speaking full sentences ABD/GI: Non-distended; soft, non-tender, no rebound, no  guarding, no peritoneal signs BACK: The back appears normal EXT: Normal ROM in all joints; no deformity noted, no edema SKIN: Normal color for age and race; warm; no rash on exposed skin NEURO: Moves all extremities equally, normal speech, normal gait, normal sensation diffusely, no facial asymmetry PSYCH: The patient's mood and manner are appropriate.   ED Results / Procedures / Treatments   LABS: (all labs ordered are listed, but only abnormal results are displayed) Labs Reviewed  COMPREHENSIVE METABOLIC PANEL WITH GFR - Abnormal; Notable for the following components:      Result Value   ALT 80 (*)    All other components within normal limits  URINALYSIS, ROUTINE W REFLEX MICROSCOPIC - Abnormal; Notable for the following components:   Color, Urine YELLOW (*)    APPearance HAZY (*)    All other components within normal limits  LIPASE, BLOOD  CBC  POC URINE PREG, ED     EKG:   RADIOLOGY: My personal review and interpretation of imaging:    I have personally reviewed all radiology reports.   No results found.   PROCEDURES:  Critical Care performed: No     Procedures    IMPRESSION / MDM / ASSESSMENT AND PLAN / ED COURSE  I reviewed the triage vital signs and the nursing notes.    Patient here with complaints of headache, nausea and vomiting.    DIFFERENTIAL DIAGNOSIS (includes but not limited to):   Migraine, Chiari I malformation, doubt intracranial hemorrhage, ruptured aneurysm, Cavernous sinus thrombosis, stroke, meningitis, CSF leak   Patient's presentation is most consistent with acute presentation with potential threat to life or bodily function.   PLAN: Labs obtained from triage are unremarkable.  Normal hemoglobin, no leukocytosis or leukopenia, normal electrolytes, normal LFTs and lipase.  Urine shows no sign of infection or dehydration.  Pregnancy test negative.  Abdominal exam benign.  Reports abdominal pain now gone.  No indication for  emergent imaging.  Doubt appendicitis, cholecystitis, bowel obstruction, perforation.  Will give medications for migraine headache and reassess.   MEDICATIONS GIVEN IN ED: Medications  sodium chloride  0.9 % bolus 1,000 mL (0 mLs Intravenous Stopped 09/03/24 0217)  ketorolac  (TORADOL ) 30 MG/ML injection 30 mg (30 mg Intravenous Given 09/03/24 0050)  prochlorperazine  (COMPAZINE ) injection 10 mg (10 mg Intravenous Given 09/03/24 0050)  diphenhydrAMINE  (BENADRYL ) injection 25 mg (25 mg Intravenous Given 09/03/24 0049)     ED COURSE: Patient reports headache completely gone after migraine cocktail.  She is ready for discharge home with her significant other at the bedside.  Will give local neurology follow-up and have also recommended follow-up with her neurosurgeon.  Recommend Tylenol , Motrin  over-the-counter as needed.  At this time, I do  not feel there is any life-threatening condition present. I reviewed all nursing notes, vitals, pertinent previous records.  All lab and urine results, EKGs, imaging ordered have been independently reviewed and interpreted by myself.  I reviewed all available radiology reports from any imaging ordered this visit.  Based on my assessment, I feel the patient is safe to be discharged home without further emergent workup and can continue workup as an outpatient as needed. Discussed all findings, treatment plan as well as usual and customary return precautions.  They verbalize understanding and are comfortable with this plan.  Outpatient follow-up has been provided as needed.  All questions have been answered.   CONSULTS:  none   OUTSIDE RECORDS REVIEWED: Reviewed recent neurosurgical note on 08/27/2024.       FINAL CLINICAL IMPRESSION(S) / ED DIAGNOSES   Final diagnoses:  Migraine without aura and without status migrainosus, not intractable  Chiari malformation type I (HCC)     Rx / DC Orders   ED Discharge Orders     None        Note:  This  document was prepared using Dragon voice recognition software and may include unintentional dictation errors.   Breaunna Gottlieb, Josette SAILOR, DO 09/03/24 [phone]

## 2024-09-03 LAB — URINALYSIS, ROUTINE W REFLEX MICROSCOPIC
Bilirubin Urine: NEGATIVE
Glucose, UA: NEGATIVE mg/dL
Hgb urine dipstick: NEGATIVE
Ketones, ur: NEGATIVE mg/dL
Leukocytes,Ua: NEGATIVE
Nitrite: NEGATIVE
Protein, ur: NEGATIVE mg/dL
Specific Gravity, Urine: 1.02 (ref 1.005–1.030)
pH: 6 (ref 5.0–8.0)

## 2024-09-03 MED ORDER — DIPHENHYDRAMINE HCL 50 MG/ML IJ SOLN
25.0000 mg | Freq: Once | INTRAMUSCULAR | Status: AC
  Administered 2024-09-03: 25 mg via INTRAVENOUS
  Filled 2024-09-03: qty 1

## 2024-09-03 MED ORDER — SODIUM CHLORIDE 0.9 % IV BOLUS (SEPSIS)
1000.0000 mL | Freq: Once | INTRAVENOUS | Status: AC
  Administered 2024-09-03: 1000 mL via INTRAVENOUS

## 2024-09-03 MED ORDER — PROCHLORPERAZINE EDISYLATE 10 MG/2ML IJ SOLN
10.0000 mg | Freq: Once | INTRAMUSCULAR | Status: AC
  Administered 2024-09-03: 10 mg via INTRAVENOUS
  Filled 2024-09-03: qty 2

## 2024-09-03 MED ORDER — KETOROLAC TROMETHAMINE 30 MG/ML IJ SOLN
30.0000 mg | Freq: Once | INTRAMUSCULAR | Status: AC
  Administered 2024-09-03: 30 mg via INTRAVENOUS
  Filled 2024-09-03: qty 1

## 2024-09-03 NOTE — Discharge Instructions (Addendum)
 Please follow-up with your neurosurgeon at [facility]   You may alternate over the counter Tylenol  1000 mg every 6 hours as needed for pain, fever and Ibuprofen  800 mg every 6-8 hours as needed for pain, fever.  Please take Ibuprofen  with food.  Do not take more than 4000 mg of Tylenol  (acetaminophen ) in a 24 hour period.

## 2024-09-05 ENCOUNTER — Encounter (INDEPENDENT_AMBULATORY_CARE_PROVIDER_SITE_OTHER): Payer: Self-pay
